# Patient Record
Sex: Female | Born: 1980 | Race: White | Hispanic: No | Marital: Married | Smoking: Never smoker
Health system: Southern US, Community
[De-identification: ages and names within clinical notes are randomized; demographics above are authoritative.]

## PROBLEM LIST (undated history)

## (undated) DIAGNOSIS — T8859XA Other complications of anesthesia, initial encounter: Secondary | ICD-10-CM

## (undated) DIAGNOSIS — D259 Leiomyoma of uterus, unspecified: Secondary | ICD-10-CM

## (undated) DIAGNOSIS — Z5189 Encounter for other specified aftercare: Secondary | ICD-10-CM

## (undated) DIAGNOSIS — Z862 Personal history of diseases of the blood and blood-forming organs and certain disorders involving the immune mechanism: Secondary | ICD-10-CM

## (undated) DIAGNOSIS — T4145XA Adverse effect of unspecified anesthetic, initial encounter: Secondary | ICD-10-CM

## (undated) DIAGNOSIS — Z789 Other specified health status: Secondary | ICD-10-CM

## (undated) DIAGNOSIS — O09299 Supervision of pregnancy with other poor reproductive or obstetric history, unspecified trimester: Secondary | ICD-10-CM

## (undated) DIAGNOSIS — F419 Anxiety disorder, unspecified: Secondary | ICD-10-CM

## (undated) HISTORY — PX: NO PAST SURGERIES: SHX2092

---

## 2009-03-17 ENCOUNTER — Encounter: Admission: RE | Admit: 2009-03-17 | Discharge: 2009-03-17 | Payer: Self-pay | Admitting: Obstetrics and Gynecology

## 2009-07-07 ENCOUNTER — Emergency Department (HOSPITAL_BASED_OUTPATIENT_CLINIC_OR_DEPARTMENT_OTHER): Admission: EM | Admit: 2009-07-07 | Discharge: 2009-07-07 | Payer: Self-pay | Admitting: Emergency Medicine

## 2009-08-25 ENCOUNTER — Encounter: Admission: RE | Admit: 2009-08-25 | Discharge: 2009-08-25 | Payer: Self-pay | Admitting: Obstetrics and Gynecology

## 2010-03-12 ENCOUNTER — Encounter: Admission: RE | Admit: 2010-03-12 | Discharge: 2010-03-12 | Payer: Self-pay | Admitting: Obstetrics and Gynecology

## 2010-08-12 LAB — URINALYSIS, ROUTINE W REFLEX MICROSCOPIC
Glucose, UA: NEGATIVE mg/dL
Nitrite: NEGATIVE
Protein, ur: NEGATIVE mg/dL
pH: 6.5 (ref 5.0–8.0)

## 2010-08-12 LAB — PREGNANCY, URINE: Preg Test, Ur: NEGATIVE

## 2010-09-06 ENCOUNTER — Other Ambulatory Visit: Payer: Self-pay | Admitting: Obstetrics and Gynecology

## 2010-09-06 DIAGNOSIS — Z09 Encounter for follow-up examination after completed treatment for conditions other than malignant neoplasm: Secondary | ICD-10-CM

## 2010-09-10 ENCOUNTER — Ambulatory Visit
Admission: RE | Admit: 2010-09-10 | Discharge: 2010-09-10 | Disposition: A | Discharge status: Home / Self Care | Payer: BC Managed Care – PPO | Source: Ambulatory Visit | Attending: Obstetrics and Gynecology | Admitting: Obstetrics and Gynecology

## 2010-09-10 DIAGNOSIS — Z09 Encounter for follow-up examination after completed treatment for conditions other than malignant neoplasm: Secondary | ICD-10-CM

## 2011-02-23 ENCOUNTER — Other Ambulatory Visit: Payer: Self-pay | Admitting: Obstetrics and Gynecology

## 2011-02-23 DIAGNOSIS — D249 Benign neoplasm of unspecified breast: Secondary | ICD-10-CM

## 2011-03-23 ENCOUNTER — Ambulatory Visit
Admission: RE | Admit: 2011-03-23 | Discharge: 2011-03-23 | Disposition: A | Discharge status: Home / Self Care | Payer: BC Managed Care – PPO | Source: Ambulatory Visit | Attending: Obstetrics and Gynecology | Admitting: Obstetrics and Gynecology

## 2011-03-23 DIAGNOSIS — D249 Benign neoplasm of unspecified breast: Secondary | ICD-10-CM

## 2013-05-23 DIAGNOSIS — O09299 Supervision of pregnancy with other poor reproductive or obstetric history, unspecified trimester: Secondary | ICD-10-CM

## 2013-05-23 DIAGNOSIS — Z5189 Encounter for other specified aftercare: Secondary | ICD-10-CM

## 2013-05-23 HISTORY — DX: Encounter for other specified aftercare: Z51.89

## 2013-05-23 HISTORY — DX: Supervision of pregnancy with other poor reproductive or obstetric history, unspecified trimester: O09.299

## 2014-02-10 LAB — OB RESULTS CONSOLE GBS: STREP GROUP B AG: NEGATIVE

## 2014-03-11 ENCOUNTER — Inpatient Hospital Stay (HOSPITAL_COMMUNITY)
Admission: AD | Admit: 2014-03-11 | Discharge: 2014-03-15 | DRG: 765 | Disposition: A | Discharge status: Home / Self Care | Payer: BC Managed Care – PPO | Source: Ambulatory Visit | Attending: Obstetrics & Gynecology | Admitting: Obstetrics & Gynecology

## 2014-03-11 ENCOUNTER — Inpatient Hospital Stay (HOSPITAL_COMMUNITY): Payer: BC Managed Care – PPO | Admitting: Anesthesiology

## 2014-03-11 ENCOUNTER — Encounter (HOSPITAL_COMMUNITY): Payer: Self-pay | Admitting: *Deleted

## 2014-03-11 ENCOUNTER — Encounter (HOSPITAL_COMMUNITY): Payer: BC Managed Care – PPO | Admitting: Anesthesiology

## 2014-03-11 DIAGNOSIS — D62 Acute posthemorrhagic anemia: Secondary | ICD-10-CM | POA: Diagnosis present

## 2014-03-11 DIAGNOSIS — Z3490 Encounter for supervision of normal pregnancy, unspecified, unspecified trimester: Secondary | ICD-10-CM

## 2014-03-11 DIAGNOSIS — O9902 Anemia complicating childbirth: Secondary | ICD-10-CM | POA: Diagnosis present

## 2014-03-11 DIAGNOSIS — Z3A40 40 weeks gestation of pregnancy: Secondary | ICD-10-CM | POA: Diagnosis present

## 2014-03-11 DIAGNOSIS — O480 Post-term pregnancy: Secondary | ICD-10-CM | POA: Diagnosis present

## 2014-03-11 DIAGNOSIS — O133 Gestational [pregnancy-induced] hypertension without significant proteinuria, third trimester: Secondary | ICD-10-CM | POA: Diagnosis present

## 2014-03-11 DIAGNOSIS — O3663X0 Maternal care for excessive fetal growth, third trimester, not applicable or unspecified: Secondary | ICD-10-CM | POA: Diagnosis present

## 2014-03-11 DIAGNOSIS — O321XX0 Maternal care for breech presentation, not applicable or unspecified: Secondary | ICD-10-CM | POA: Diagnosis present

## 2014-03-11 DIAGNOSIS — Z98891 History of uterine scar from previous surgery: Secondary | ICD-10-CM

## 2014-03-11 HISTORY — DX: Other specified health status: Z78.9

## 2014-03-11 LAB — COMPREHENSIVE METABOLIC PANEL
ALT: 9 U/L (ref 0–35)
AST: 15 U/L (ref 0–37)
Albumin: 2.9 g/dL — ABNORMAL LOW (ref 3.5–5.2)
Alkaline Phosphatase: 168 U/L — ABNORMAL HIGH (ref 39–117)
Anion gap: 16 — ABNORMAL HIGH (ref 5–15)
BUN: 14 mg/dL (ref 6–23)
CALCIUM: 9 mg/dL (ref 8.4–10.5)
CO2: 19 mEq/L (ref 19–32)
Chloride: 100 mEq/L (ref 96–112)
Creatinine, Ser: 0.68 mg/dL (ref 0.50–1.10)
GFR calc non Af Amer: >90 mL/min (ref >90)
GLUCOSE: 68 mg/dL — AB (ref 70–99)
Potassium: 4.1 mEq/L (ref 3.7–5.3)
Sodium: 135 mEq/L — ABNORMAL LOW (ref 137–147)
TOTAL PROTEIN: 6.4 g/dL (ref 6.0–8.3)
Total Bilirubin: <0.2 mg/dL — ABNORMAL LOW (ref 0.3–1.2)

## 2014-03-11 LAB — OB RESULTS CONSOLE RPR: RPR: NONREACTIVE

## 2014-03-11 LAB — CBC
HCT: 40.2 % (ref 36.0–46.0)
Hemoglobin: 13.8 g/dL (ref 12.0–15.0)
MCH: 31.9 pg (ref 26.0–34.0)
MCHC: 34.3 g/dL (ref 30.0–36.0)
MCV: 93.1 fL (ref 78.0–100.0)
PLATELETS: 250 10*3/uL (ref 150–400)
RBC: 4.32 MIL/uL (ref 3.87–5.11)
RDW: 12.8 % (ref 11.5–15.5)
WBC: 10.9 10*3/uL — ABNORMAL HIGH (ref 4.0–10.5)

## 2014-03-11 LAB — LACTATE DEHYDROGENASE: LDH: 180 U/L (ref 94–250)

## 2014-03-11 LAB — ABO/RH: ABO/RH(D): AB POS

## 2014-03-11 LAB — OB RESULTS CONSOLE GC/CHLAMYDIA
CHLAMYDIA, DNA PROBE: NEGATIVE
Gonorrhea: NEGATIVE

## 2014-03-11 LAB — OB RESULTS CONSOLE ANTIBODY SCREEN: ANTIBODY SCREEN: NEGATIVE

## 2014-03-11 LAB — OB RESULTS CONSOLE ABO/RH: RH TYPE: POSITIVE

## 2014-03-11 LAB — OB RESULTS CONSOLE RUBELLA ANTIBODY, IGM: RUBELLA: IMMUNE

## 2014-03-11 LAB — URIC ACID: URIC ACID, SERUM: 5.4 mg/dL (ref 2.4–7.0)

## 2014-03-11 LAB — OB RESULTS CONSOLE HEPATITIS B SURFACE ANTIGEN: HEP B S AG: NEGATIVE

## 2014-03-11 LAB — OB RESULTS CONSOLE HIV ANTIBODY (ROUTINE TESTING): HIV: NONREACTIVE

## 2014-03-11 MED ORDER — FENTANYL 2.5 MCG/ML BUPIVACAINE 1/10 % EPIDURAL INFUSION (WH - ANES)
14.0000 mL/h | INTRAMUSCULAR | Status: DC | PRN
  Administered 2014-03-11: 14 mL/h via EPIDURAL
  Filled 2014-03-11: qty 125

## 2014-03-11 MED ORDER — ACETAMINOPHEN 325 MG PO TABS: 650.0000 mg | ORAL_TABLET | ORAL | Status: DC | PRN

## 2014-03-11 MED ORDER — TERBUTALINE SULFATE 1 MG/ML IJ SOLN
0.2500 mg | Freq: Once | INTRAMUSCULAR | Status: AC | PRN
  Filled 2014-03-11: qty 1

## 2014-03-11 MED ORDER — DIPHENHYDRAMINE HCL 50 MG/ML IJ SOLN: 12.5000 mg | INTRAMUSCULAR | Status: DC | PRN

## 2014-03-11 MED ORDER — ONDANSETRON HCL 4 MG/2ML IJ SOLN: 4.0000 mg | Freq: Four times a day (QID) | INTRAMUSCULAR | Status: DC | PRN

## 2014-03-11 MED ORDER — PHENYLEPHRINE 40 MCG/ML (10ML) SYRINGE FOR IV PUSH (FOR BLOOD PRESSURE SUPPORT)
80.0000 ug | PREFILLED_SYRINGE | INTRAVENOUS | Status: DC | PRN
  Filled 2014-03-11: qty 10
  Filled 2014-03-11: qty 2

## 2014-03-11 MED ORDER — LIDOCAINE HCL (PF) 1 % IJ SOLN
INTRAMUSCULAR | Status: DC | PRN
  Administered 2014-03-11 (×4): 4 mL

## 2014-03-11 MED ORDER — CITRIC ACID-SODIUM CITRATE 334-500 MG/5ML PO SOLN
30.0000 mL | ORAL | Status: DC | PRN
  Administered 2014-03-12: 30 mL via ORAL
  Filled 2014-03-11: qty 15

## 2014-03-11 MED ORDER — LIDOCAINE HCL (PF) 1 % IJ SOLN: 30.0000 mL | INTRAMUSCULAR | Status: DC | PRN

## 2014-03-11 MED ORDER — PHENYLEPHRINE 40 MCG/ML (10ML) SYRINGE FOR IV PUSH (FOR BLOOD PRESSURE SUPPORT)
80.0000 ug | PREFILLED_SYRINGE | INTRAVENOUS | Status: DC | PRN
  Filled 2014-03-11: qty 2
  Filled 2014-03-11: qty 10

## 2014-03-11 MED ORDER — EPHEDRINE 5 MG/ML INJ
10.0000 mg | INTRAVENOUS | Status: DC | PRN
  Filled 2014-03-11: qty 2

## 2014-03-11 MED ORDER — MISOPROSTOL 25 MCG QUARTER TABLET
25.0000 ug | ORAL_TABLET | ORAL | Status: DC | PRN
  Administered 2014-03-11: 25 ug via VAGINAL
  Filled 2014-03-11: qty 0.25

## 2014-03-11 MED ORDER — OXYCODONE-ACETAMINOPHEN 5-325 MG PO TABS: 2.0000 | ORAL_TABLET | ORAL | Status: DC | PRN

## 2014-03-11 MED ORDER — LACTATED RINGERS IV SOLN
INTRAVENOUS | Status: DC
  Administered 2014-03-11: 125 mL/h via INTRAVENOUS
  Administered 2014-03-12 (×4): via INTRAVENOUS

## 2014-03-11 MED ORDER — OXYTOCIN BOLUS FROM INFUSION: 500.0000 mL | INTRAVENOUS | Status: DC

## 2014-03-11 MED ORDER — LACTATED RINGERS IV SOLN
500.0000 mL | INTRAVENOUS | Status: DC | PRN
  Administered 2014-03-12: 1000 mL via INTRAVENOUS

## 2014-03-11 MED ORDER — FLEET ENEMA 7-19 GM/118ML RE ENEM: 1.0000 | ENEMA | RECTAL | Status: DC | PRN

## 2014-03-11 MED ORDER — OXYCODONE-ACETAMINOPHEN 5-325 MG PO TABS: 1.0000 | ORAL_TABLET | ORAL | Status: DC | PRN

## 2014-03-11 MED ORDER — LACTATED RINGERS IV SOLN
500.0000 mL | Freq: Once | INTRAVENOUS | Status: AC
  Administered 2014-03-11: 500 mL via INTRAVENOUS

## 2014-03-11 MED ORDER — OXYTOCIN 40 UNITS IN LACTATED RINGERS INFUSION - SIMPLE MED: 62.5000 mL/h | INTRAVENOUS | Status: DC

## 2014-03-11 NOTE — Anesthesia Procedure Notes (Signed)
Epidural Patient location during procedure: OB Start time: 03/11/2014 11:27 PM  Staffing Anesthesiologist: Marrietta Thunder Performed by: anesthesiologist   Preanesthetic Checklist Completed: patient identified, site marked, surgical consent, pre-op evaluation, timeout performed, IV checked, risks and benefits discussed and monitors and equipment checked  Epidural Patient position: sitting Prep: site prepped and draped and DuraPrep Patient monitoring: continuous pulse ox and blood pressure Approach: midline Location: L3-L4 Injection technique: LOR air  Needle:  Needle type: Tuohy  Needle gauge: 17 G Needle length: 9 cm and 9 Needle insertion depth: 5 cm cm Catheter type: closed end flexible Catheter size: 19 Gauge Catheter at skin depth: 10 cm Test dose: negative  Assessment Events: blood not aspirated, injection not painful, no injection resistance, negative IV test and no paresthesia  Additional Notes Discussed risk of headache, infection, bleeding, nerve injury and failed or incomplete block.  Patient voices understanding and wishes to proceed.  Epidural placed easily on first attempt.  No paresthesia. Patient tolerated procedure well with no apparent complications.  Charlton Haws, MDReason for block:procedure for pain

## 2014-03-11 NOTE — Anesthesia Preprocedure Evaluation (Addendum)
Anesthesia Evaluation  Patient identified by MRN, date of birth, ID band Patient awake    Reviewed: Allergy & Precautions, H&P , NPO status , Patient's Chart, lab work & pertinent test results, reviewed documented beta blocker date and time   History of Anesthesia Complications Negative for: history of anesthetic complications  Airway Mallampati: II      Dental  (+) Teeth Intact   Pulmonary neg pulmonary ROS,  breath sounds clear to auscultation  Pulmonary exam normal       Cardiovascular hypertension (admitted for IOL for GHTN), Rhythm:regular Rate:Normal     Neuro/Psych negative neurological ROS  negative psych ROS   GI/Hepatic negative GI ROS, Neg liver ROS,   Endo/Other  negative endocrine ROS  Renal/GU      Musculoskeletal   Abdominal   Peds  Hematology negative hematology ROS (+)   Anesthesia Other Findings   Reproductive/Obstetrics negative OB ROS (+) Pregnancy (LGA, failure to descend --> primary C/S)                          Anesthesia Physical Anesthesia Plan  ASA: II and emergent  Anesthesia Plan: Epidural   Post-op Pain Management:    Induction:   Airway Management Planned:   Additional Equipment:   Intra-op Plan:   Post-operative Plan:   Informed Consent: I have reviewed the patients History and Physical, chart, labs and discussed the procedure including the risks, benefits and alternatives for the proposed anesthesia with the patient or authorized representative who has indicated his/her understanding and acceptance.   Dental Advisory Given  Plan Discussed with: CRNA and Surgeon  Anesthesia Plan Comments:        Anesthesia Quick Evaluation

## 2014-03-11 NOTE — H&P (Signed)
Kerri Yu is a 33 y.o. female presenting for induction of labor secondary to gestational hypertension; post-dates.  Patient has had uncomplicated pregnancy.  Last u/s 10/14 with EFW 9,6 and AC measuring 42 weeks; normal glucola.  Patient denies HA, CP/SOB, RUQ pain, no visual disturbance.    Maternal Medical History:  Fetal activity: Perceived fetal activity is normal.   Last perceived fetal movement was within the past hour.    Prenatal complications: PIH.   Prenatal Complications - Diabetes: none.    OB History   Grav Para Term Preterm Abortions TAB SAB Ect Mult Living   1 0 0 0 0 0 0 0 0 0      Past Medical History  Diagnosis Date  . Medical history non-contributory    Past Surgical History  Procedure Laterality Date  . No past surgeries     Family History: family history is not on file. Social History:  reports that she has never smoked. She has never used smokeless tobacco. She reports that she does not drink alcohol or use illicit drugs.   Prenatal Transfer Tool  Maternal Diabetes: No Genetic Screening: Normal Maternal Ultrasounds/Referrals: Normal Fetal Ultrasounds or other Referrals:  None Maternal Substance Abuse:  No Significant Maternal Medications:  None Significant Maternal Lab Results:  Lab values include: Group B Strep negative Other Comments:  None  ROS  Dilation: 1.5 Effacement (%): 60 Station: -2 Exam by:: Cason Dabney Blood pressure 116/74, pulse 91, temperature 98.5 F (36.9 C), temperature source Oral, resp. rate 16, height 5\' 4"  (1.626 m), weight 160 lb (72.576 kg), last menstrual period 06/01/2013. Maternal Exam:  Uterine Assessment: Contraction strength is mild.  Contraction frequency is rare.   Abdomen: Patient reports no abdominal tenderness. Fundal height is S>D.   Estimated fetal weight is 9#6 (u/s 10/14).   Fetal presentation: vertex  Introitus: Normal vulva. Pelvis: adequate for delivery.   Cervix: Cervix evaluated by digital exam.      Physical Exam  Constitutional: She is oriented to person, place, and time. She appears well-developed and well-nourished.  GI: Soft. There is no rebound and no guarding.  Neurological: She is alert and oriented to person, place, and time.  Skin: Skin is warm and dry.  Psychiatric: She has a normal mood and affect. Her behavior is normal.    Prenatal labs: ABO, Rh:   Antibody:   Rubella:   RPR:    HBsAg:    HIV:    GBS:     Assessment/Plan: 33yo G1 at [redacted]w[redacted]d for IOL -HELLP labs pending; will watch BPs -Plan VMP induction; patient counseled -Epidural if desired   Navin Dogan 03/11/2014, 6:57 PM

## 2014-03-12 ENCOUNTER — Encounter (HOSPITAL_COMMUNITY): Payer: Self-pay

## 2014-03-12 ENCOUNTER — Encounter (HOSPITAL_COMMUNITY)
Admission: AD | Disposition: A | Discharge status: Home / Self Care | Payer: Self-pay | Source: Ambulatory Visit | Attending: Obstetrics & Gynecology

## 2014-03-12 DIAGNOSIS — Z98891 History of uterine scar from previous surgery: Secondary | ICD-10-CM

## 2014-03-12 LAB — CBC
HCT: 24.1 % — ABNORMAL LOW (ref 36.0–46.0)
HEMATOCRIT: 26.4 % — AB (ref 36.0–46.0)
HEMOGLOBIN: 8.5 g/dL — AB (ref 12.0–15.0)
Hemoglobin: 9.3 g/dL — ABNORMAL LOW (ref 12.0–15.0)
MCH: 32.2 pg (ref 26.0–34.0)
MCH: 32.3 pg (ref 26.0–34.0)
MCHC: 35.2 g/dL (ref 30.0–36.0)
MCHC: 35.3 g/dL (ref 30.0–36.0)
MCV: 91.3 fL (ref 78.0–100.0)
MCV: 91.6 fL (ref 78.0–100.0)
Platelets: 147 10*3/uL — ABNORMAL LOW (ref 150–400)
Platelets: 135 10*3/uL — ABNORMAL LOW (ref 150–400)
RBC: 2.89 MIL/uL — ABNORMAL LOW (ref 3.87–5.11)
RBC: 2.63 MIL/uL — AB (ref 3.87–5.11)
RDW: 13.1 % (ref 11.5–15.5)
RDW: 13.3 % (ref 11.5–15.5)
WBC: 15.8 10*3/uL — ABNORMAL HIGH (ref 4.0–10.5)
WBC: 16.6 10*3/uL — AB (ref 4.0–10.5)

## 2014-03-12 LAB — RPR

## 2014-03-12 SURGERY — Surgical Case
Anesthesia: Epidural | Site: Abdomen

## 2014-03-12 MED ORDER — NALBUPHINE HCL 10 MG/ML IJ SOLN: 5.0000 mg | INTRAMUSCULAR | Status: DC | PRN

## 2014-03-12 MED ORDER — NALBUPHINE HCL 10 MG/ML IJ SOLN: 5.0000 mg | Freq: Once | INTRAMUSCULAR | Status: AC | PRN

## 2014-03-12 MED ORDER — SIMETHICONE 80 MG PO CHEW
80.0000 mg | CHEWABLE_TABLET | Freq: Three times a day (TID) | ORAL | Status: DC
  Administered 2014-03-12 – 2014-03-15 (×9): 80 mg via ORAL
  Filled 2014-03-12 (×9): qty 1

## 2014-03-12 MED ORDER — ONDANSETRON HCL 4 MG/2ML IJ SOLN
INTRAMUSCULAR | Status: AC
  Filled 2014-03-12: qty 2

## 2014-03-12 MED ORDER — MORPHINE SULFATE 0.5 MG/ML IJ SOLN
INTRAMUSCULAR | Status: AC
  Filled 2014-03-12: qty 10

## 2014-03-12 MED ORDER — NALOXONE HCL 0.4 MG/ML IJ SOLN: 0.4000 mg | INTRAMUSCULAR | Status: DC | PRN

## 2014-03-12 MED ORDER — SIMETHICONE 80 MG PO CHEW: 80.0000 mg | CHEWABLE_TABLET | ORAL | Status: DC | PRN

## 2014-03-12 MED ORDER — DIBUCAINE 1 % RE OINT
1.0000 | TOPICAL_OINTMENT | RECTAL | Status: DC | PRN
Start: 2014-03-12 — End: 2014-03-15

## 2014-03-12 MED ORDER — WITCH HAZEL-GLYCERIN EX PADS: 1.0000 "application " | MEDICATED_PAD | CUTANEOUS | Status: DC | PRN

## 2014-03-12 MED ORDER — SIMETHICONE 80 MG PO CHEW
80.0000 mg | CHEWABLE_TABLET | ORAL | Status: DC
  Administered 2014-03-13 (×2): 80 mg via ORAL
  Filled 2014-03-12 (×3): qty 1

## 2014-03-12 MED ORDER — OXYTOCIN 40 UNITS IN LACTATED RINGERS INFUSION - SIMPLE MED: 62.5000 mL/h | INTRAVENOUS | Status: AC

## 2014-03-12 MED ORDER — KETOROLAC TROMETHAMINE 30 MG/ML IJ SOLN
INTRAMUSCULAR | Status: AC
  Filled 2014-03-12: qty 1

## 2014-03-12 MED ORDER — SODIUM CHLORIDE 0.9 % IJ SOLN: 3.0000 mL | INTRAMUSCULAR | Status: DC | PRN

## 2014-03-12 MED ORDER — ONDANSETRON HCL 4 MG/2ML IJ SOLN
INTRAMUSCULAR | Status: DC | PRN
  Administered 2014-03-12: 4 mg via INTRAVENOUS

## 2014-03-12 MED ORDER — SCOPOLAMINE 1 MG/3DAYS TD PT72
MEDICATED_PATCH | TRANSDERMAL | Status: DC | PRN
  Administered 2014-03-12: 1 via TRANSDERMAL

## 2014-03-12 MED ORDER — SCOPOLAMINE 1 MG/3DAYS TD PT72
MEDICATED_PATCH | TRANSDERMAL | Status: AC
  Filled 2014-03-12: qty 1

## 2014-03-12 MED ORDER — PHENYLEPHRINE HCL 10 MG/ML IJ SOLN
INTRAMUSCULAR | Status: DC | PRN
  Administered 2014-03-12: 40 ug via INTRAVENOUS
  Administered 2014-03-12: 80 ug via INTRAVENOUS
  Administered 2014-03-12: 40 ug via INTRAVENOUS
  Administered 2014-03-12: 80 ug via INTRAVENOUS
  Administered 2014-03-12: 40 ug via INTRAVENOUS
  Administered 2014-03-12: 80 ug via INTRAVENOUS
  Administered 2014-03-12 (×2): 40 ug via INTRAVENOUS
  Administered 2014-03-12: 80 ug via INTRAVENOUS
  Administered 2014-03-12 (×2): 40 ug via INTRAVENOUS
  Administered 2014-03-12: 80 ug via INTRAVENOUS
  Administered 2014-03-12: 40 ug via INTRAVENOUS
  Administered 2014-03-12: 80 ug via INTRAVENOUS

## 2014-03-12 MED ORDER — CEFAZOLIN SODIUM-DEXTROSE 2-3 GM-% IV SOLR
2.0000 g | Freq: Once | INTRAVENOUS | Status: AC
  Administered 2014-03-12: 2 g via INTRAVENOUS
  Filled 2014-03-12: qty 50

## 2014-03-12 MED ORDER — OXYTOCIN 10 UNIT/ML IJ SOLN
INTRAMUSCULAR | Status: AC
  Filled 2014-03-12: qty 4

## 2014-03-12 MED ORDER — KETOROLAC TROMETHAMINE 30 MG/ML IJ SOLN: 15.0000 mg | Freq: Once | INTRAMUSCULAR | Status: DC | PRN

## 2014-03-12 MED ORDER — LIDOCAINE-EPINEPHRINE (PF) 2 %-1:200000 IJ SOLN
INTRAMUSCULAR | Status: AC
  Filled 2014-03-12: qty 20

## 2014-03-12 MED ORDER — DIPHENHYDRAMINE HCL 25 MG PO CAPS: 25.0000 mg | ORAL_CAPSULE | Freq: Four times a day (QID) | ORAL | Status: DC | PRN

## 2014-03-12 MED ORDER — MENTHOL 3 MG MT LOZG: 1.0000 | LOZENGE | OROMUCOSAL | Status: DC | PRN

## 2014-03-12 MED ORDER — OXYTOCIN 10 UNIT/ML IJ SOLN
40.0000 [IU] | INTRAVENOUS | Status: DC | PRN
  Administered 2014-03-12: 40 [IU] via INTRAVENOUS

## 2014-03-12 MED ORDER — SODIUM BICARBONATE 8.4 % IV SOLN
INTRAVENOUS | Status: AC
  Filled 2014-03-12: qty 50

## 2014-03-12 MED ORDER — LANOLIN HYDROUS EX OINT
1.0000 | TOPICAL_OINTMENT | CUTANEOUS | Status: DC | PRN
Start: 2014-03-12 — End: 2014-03-15

## 2014-03-12 MED ORDER — IBUPROFEN 600 MG PO TABS
600.0000 mg | ORAL_TABLET | Freq: Four times a day (QID) | ORAL | Status: DC
  Administered 2014-03-12 – 2014-03-15 (×12): 600 mg via ORAL
  Filled 2014-03-12 (×13): qty 1

## 2014-03-12 MED ORDER — ONDANSETRON HCL 4 MG PO TABS: 4.0000 mg | ORAL_TABLET | ORAL | Status: DC | PRN

## 2014-03-12 MED ORDER — MEPERIDINE HCL 25 MG/ML IJ SOLN: 6.2500 mg | INTRAMUSCULAR | Status: DC | PRN

## 2014-03-12 MED ORDER — METHYLERGONOVINE MALEATE 0.2 MG/ML IJ SOLN
INTRAMUSCULAR | Status: AC
  Filled 2014-03-12: qty 1

## 2014-03-12 MED ORDER — DIPHENHYDRAMINE HCL 25 MG PO CAPS
25.0000 mg | ORAL_CAPSULE | ORAL | Status: DC | PRN
  Filled 2014-03-12: qty 1

## 2014-03-12 MED ORDER — PHENYLEPHRINE 40 MCG/ML (10ML) SYRINGE FOR IV PUSH (FOR BLOOD PRESSURE SUPPORT)
PREFILLED_SYRINGE | INTRAVENOUS | Status: AC
  Filled 2014-03-12: qty 5

## 2014-03-12 MED ORDER — DIPHENHYDRAMINE HCL 50 MG/ML IJ SOLN: 12.5000 mg | INTRAMUSCULAR | Status: DC | PRN

## 2014-03-12 MED ORDER — ONDANSETRON HCL 4 MG/2ML IJ SOLN: 4.0000 mg | INTRAMUSCULAR | Status: DC | PRN

## 2014-03-12 MED ORDER — LACTATED RINGERS IV SOLN
INTRAVENOUS | Status: DC | PRN
  Administered 2014-03-12 (×2): via INTRAVENOUS

## 2014-03-12 MED ORDER — PRENATAL MULTIVITAMIN CH
1.0000 | ORAL_TABLET | Freq: Every day | ORAL | Status: DC
  Administered 2014-03-13 – 2014-03-15 (×3): 1 via ORAL
  Filled 2014-03-12 (×3): qty 1

## 2014-03-12 MED ORDER — METOCLOPRAMIDE HCL 5 MG/ML IJ SOLN: 10.0000 mg | Freq: Once | INTRAMUSCULAR | Status: DC | PRN

## 2014-03-12 MED ORDER — FENTANYL CITRATE 0.05 MG/ML IJ SOLN: 25.0000 ug | INTRAMUSCULAR | Status: DC | PRN

## 2014-03-12 MED ORDER — NALOXONE HCL 1 MG/ML IJ SOLN
1.0000 ug/kg/h | INTRAVENOUS | Status: DC | PRN
  Filled 2014-03-12: qty 2

## 2014-03-12 MED ORDER — TETANUS-DIPHTH-ACELL PERTUSSIS 5-2.5-18.5 LF-MCG/0.5 IM SUSP: 0.5000 mL | Freq: Once | INTRAMUSCULAR | Status: DC

## 2014-03-12 MED ORDER — OXYCODONE-ACETAMINOPHEN 5-325 MG PO TABS
1.0000 | ORAL_TABLET | ORAL | Status: DC | PRN
  Administered 2014-03-13: 1 via ORAL
  Filled 2014-03-12: qty 1

## 2014-03-12 MED ORDER — ONDANSETRON HCL 4 MG/2ML IJ SOLN: 4.0000 mg | Freq: Three times a day (TID) | INTRAMUSCULAR | Status: DC | PRN

## 2014-03-12 MED ORDER — KETOROLAC TROMETHAMINE 30 MG/ML IJ SOLN: 30.0000 mg | Freq: Four times a day (QID) | INTRAMUSCULAR | Status: AC | PRN

## 2014-03-12 MED ORDER — OXYCODONE-ACETAMINOPHEN 5-325 MG PO TABS: 2.0000 | ORAL_TABLET | ORAL | Status: DC | PRN

## 2014-03-12 MED ORDER — INFLUENZA VAC SPLIT QUAD 0.5 ML IM SUSY: 0.5000 mL | PREFILLED_SYRINGE | INTRAMUSCULAR | Status: DC

## 2014-03-12 MED ORDER — SODIUM BICARBONATE 8.4 % IV SOLN
INTRAVENOUS | Status: DC | PRN
  Administered 2014-03-12 (×2): 5 mL via EPIDURAL

## 2014-03-12 MED ORDER — LACTATED RINGERS IV SOLN
INTRAVENOUS | Status: DC
  Administered 2014-03-12 – 2014-03-13 (×3): via INTRAVENOUS

## 2014-03-12 MED ORDER — CEFAZOLIN SODIUM-DEXTROSE 2-3 GM-% IV SOLR
INTRAVENOUS | Status: AC
  Filled 2014-03-12: qty 50

## 2014-03-12 MED ORDER — ZOLPIDEM TARTRATE 5 MG PO TABS: 5.0000 mg | ORAL_TABLET | Freq: Every evening | ORAL | Status: DC | PRN

## 2014-03-12 MED ORDER — KETOROLAC TROMETHAMINE 30 MG/ML IJ SOLN
30.0000 mg | Freq: Four times a day (QID) | INTRAMUSCULAR | Status: AC | PRN
  Administered 2014-03-12: 30 mg via INTRAMUSCULAR

## 2014-03-12 MED ORDER — METHYLERGONOVINE MALEATE 0.2 MG/ML IJ SOLN
0.2000 mg | Freq: Once | INTRAMUSCULAR | Status: AC
  Administered 2014-03-12: 0.2 mg via INTRAMUSCULAR

## 2014-03-12 MED ORDER — SENNOSIDES-DOCUSATE SODIUM 8.6-50 MG PO TABS
2.0000 | ORAL_TABLET | ORAL | Status: DC
  Administered 2014-03-13 – 2014-03-15 (×3): 2 via ORAL
  Filled 2014-03-12 (×3): qty 2

## 2014-03-12 MED ORDER — SCOPOLAMINE 1 MG/3DAYS TD PT72: 1.0000 | MEDICATED_PATCH | Freq: Once | TRANSDERMAL | Status: DC

## 2014-03-12 SURGICAL SUPPLY — 34 items
BENZOIN TINCTURE PRP APPL 2/3 (GAUZE/BANDAGES/DRESSINGS) ×3 IMPLANT
CLAMP CORD UMBIL (MISCELLANEOUS) IMPLANT
CLOSURE WOUND 1/2 X4 (GAUZE/BANDAGES/DRESSINGS) ×1
CLOTH BEACON ORANGE TIMEOUT ST (SAFETY) ×3 IMPLANT
DERMABOND ADVANCED (GAUZE/BANDAGES/DRESSINGS)
DERMABOND ADVANCED .7 DNX12 (GAUZE/BANDAGES/DRESSINGS) IMPLANT
DRAPE SHEET LG 3/4 BI-LAMINATE (DRAPES) IMPLANT
DRSG OPSITE POSTOP 4X10 (GAUZE/BANDAGES/DRESSINGS) ×3 IMPLANT
DURAPREP 26ML APPLICATOR (WOUND CARE) ×3 IMPLANT
ELECT REM PT RETURN 9FT ADLT (ELECTROSURGICAL) ×3
ELECTRODE REM PT RTRN 9FT ADLT (ELECTROSURGICAL) ×1 IMPLANT
EXTRACTOR VACUUM KIWI (MISCELLANEOUS) ×3 IMPLANT
EXTRACTOR VACUUM M CUP 4 TUBE (SUCTIONS) IMPLANT
EXTRACTOR VACUUM M CUP 4' TUBE (SUCTIONS)
GLOVE BIO SURGEON STRL SZ 6 (GLOVE) ×3 IMPLANT
GLOVE BIOGEL PI IND STRL 6 (GLOVE) ×2 IMPLANT
GLOVE BIOGEL PI INDICATOR 6 (GLOVE) ×4
GOWN STRL REUS W/TWL LRG LVL3 (GOWN DISPOSABLE) ×6 IMPLANT
KIT ABG SYR 3ML LUER SLIP (SYRINGE) ×3 IMPLANT
NEEDLE HYPO 25X5/8 SAFETYGLIDE (NEEDLE) ×3 IMPLANT
NS IRRIG 1000ML POUR BTL (IV SOLUTION) ×3 IMPLANT
PACK C SECTION WH (CUSTOM PROCEDURE TRAY) ×3 IMPLANT
PAD OB MATERNITY 4.3X12.25 (PERSONAL CARE ITEMS) ×3 IMPLANT
STAPLER VISISTAT 35W (STAPLE) IMPLANT
STRIP CLOSURE SKIN 1/2X4 (GAUZE/BANDAGES/DRESSINGS) ×2 IMPLANT
SUT CHROMIC 0 CTX 36 (SUTURE) ×9 IMPLANT
SUT MON AB 2-0 CT1 27 (SUTURE) ×3 IMPLANT
SUT PDS AB 0 CT1 27 (SUTURE) IMPLANT
SUT PLAIN 0 NONE (SUTURE) IMPLANT
SUT VIC AB 0 CT1 36 (SUTURE) ×3 IMPLANT
SUT VIC AB 4-0 KS 27 (SUTURE) ×3 IMPLANT
TOWEL OR 17X24 6PK STRL BLUE (TOWEL DISPOSABLE) ×3 IMPLANT
TRAY FOLEY CATH 14FR (SET/KITS/TRAYS/PACK) IMPLANT
WATER STERILE IRR 1000ML POUR (IV SOLUTION) ×3 IMPLANT

## 2014-03-12 NOTE — Progress Notes (Signed)
CTSP: Had about 200cc of clots in PACU  VSS FF Normal bleeding on perineum noted Examined with Dr Marni Griffon  Will give methergine, IV fluids, observation, T&S

## 2014-03-12 NOTE — Transfer of Care (Signed)
Immediate Anesthesia Transfer of Care Note  Patient: Kerri Yu  Procedure(s) Performed: Procedure(s): CESAREAN SECTION (N/A)  Patient Location: PACU  Anesthesia Type:Epidural  Level of Consciousness: awake, alert  and oriented  Airway & Oxygen Therapy: Patient Spontanous Breathing  Post-op Assessment: Report given to PACU RN and Post -op Vital signs reviewed and stable  Post vital signs: Reviewed and stable  Complications: No apparent anesthesia complications

## 2014-03-12 NOTE — Anesthesia Postprocedure Evaluation (Addendum)
  Anesthesia Post-op Note  Patient: Kerri Yu  Procedure(s) Performed: Procedure(s): CESAREAN SECTION (N/A)  Patient Location: PACU  Anesthesia Type:Epidural  Level of Consciousness: awake, alert  and oriented  Airway and Oxygen Therapy: Patient Spontanous Breathing  Post-op Pain: none  Post-op Assessment: Post-op Vital signs reviewed, Patient's Cardiovascular Status Stable, Respiratory Function Stable, Patent Airway, No signs of Nausea or vomiting, Pain level controlled, No headache, No backache, No residual numbness and No residual motor weakness. Will leave epidural catheter in place in case of Post Partum hemorrhage. Plan to D/C later this pm or in am.  Post-op Vital Signs: Reviewed and stable  Last Vitals:  Filed Vitals:   03/12/14 0945  BP: 135/82  Pulse: 80  Temp:   Resp: 19    Complications: No apparent anesthesia complications

## 2014-03-12 NOTE — Op Note (Signed)
[name] PROCEDURE DATE: 03/11/2014 - 03/12/2014  PREOPERATIVE DIAGNOSIS: Intrauterine pregnancy at  [redacted]w[redacted]d weeks gestation with arrest of descent  POSTOPERATIVE DIAGNOSIS: The same  PROCEDURE:   Primary Low Transverse Cesarean Section  SURGEON:  Dr. [name]  INDICATIONS: Kerri Yu is a 33 y.o. G1P1001 at [redacted]w[redacted]d scheduled for cesarean section secondary to arrest of descent.  The risks of cesarean section discussed with the patient included but were not limited to: bleeding which may require transfusion or reoperation; infection which may require antibiotics; injury to bowel, bladder, ureters or other surrounding organs; injury to the fetus; need for additional procedures including hysterectomy in the event of a life-threatening hemorrhage; placental abnormalities wth subsequent pregnancies, incisional problems, thromboembolic phenomenon and other postoperative/anesthesia complications. The patient concurred with the proposed plan, giving informed written consent for the procedure.    FINDINGS:  Viable female infant in cephalic presentation but turned to frank breech for delivery, APGARs 2,9:  Weight 9#9.  Clear amniotic fluid.  Intact placenta, three vessel cord.  Grossly normal ovaries and fallopian tubes.  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 .   ANESTHESIA:    Epidural ESTIMATED BLOOD LOSS: 1000 ml SPECIMENS: Placenta sent to pathology COMPLICATIONS: None immediate  PROCEDURE IN DETAIL:  The patient received intravenous antibiotics and had sequential compression devices applied to her lower extremities while in the preoperative area.  She was then taken to the operating room where epidural anesthesia was dosed up to surgical level and was found to be adequate. She was then placed in a dorsal supine position with a leftward tilt, and prepped and draped in a sterile manner.  A foley catheter was placed into her bladder and attached to constant gravity.  After an  adequate timeout was performed, a Pfannenstiel skin incision was made with scalpel and carried through to the underlying layer of fascia. The fascia was incised in the midline and this incision was extended bilaterally using the Mayo scissors. Kocher clamps were applied to the superior aspect of the fascial incision and the underlying rectus muscles were dissected off bluntly. A similar process was carried out on the inferior aspect of the facial incision. The rectus muscles were separated in the midline bluntly and the peritoneum was entered bluntly.   A bladder flap was created sharply and developed bluntly.  The bladder was protected behind the bladder blade.  A transverse hysterotomy was made with a scalpel and extended bilaterally bluntly. The bladder blade was then removed. After attempt was made to deliver the infant with a single Kiwi vacuum pull attempt with pop off, the baby turned to frank breech.  Using typical breech maneuvers, the baby was delivered. The cord was clamped and cut and infant was handed over to awaiting neonatology team. Uterine massage was then administered and the placenta delivered intact with three-vessel cord. The uterus was cleared of clot and debris.  The hysterotomy was closed with 0 chromic.  A second imbricating suture of 0-chromic was used to reinforce the incision and aid in hemostasis.  The peritoneum and rectus muscles were noted to be hemostatic and were reapproximated using 2-0 monocryl in a running fashion.  The fascia was closed with 0-Vicryl in a running fashion with good restoration of anatomy.  The subcutaneus tissue was copiously irrigated.  The skin was closed with 4-0 vicryl in a subcuticular fashion.  Pt tolerated the procedure will.  All counts were correct x2.  Pt went to the recovery room in stable condition.

## 2014-03-12 NOTE — Lactation Note (Signed)
This note was copied from the chart of Kerri [name]. Lactation Consultation Note Initial visit at 13 hours of age.  Baby has been spitting up mucous and mom had a 1500 EBL with labor and then c/s delivery.  Discussed normal lactogenesis II and how to contact for assist if mom feels her milk supply is not adequate due to blood loss.  Worked on hand expression with colostrum collected in small container to give to baby when he is ready to feed.  Hand pump given with instructions to help evert nipples that flatten with compression.  Baby asleep in crib.  Placed baby STS with mom baby is not waking for feeding.  [facility] LC resources given and discussed.  Encouraged to feed with early cues on demand.  Early newborn behavior discussed.   Mom plans to wake baby with STS for breast attempts and will try to offer colostrum when baby wakes.  Discussed DEBP, mom will let us know if she is ready for pumping.   Mom to call for assist as needed.      Patient Name: Kerri Yu Today's Date: 03/12/2014 Reason for consult: Initial assessment   Maternal Data Has patient been taught Hand Expression?: Yes Does the patient have breastfeeding experience prior to this delivery?: No  Feeding Feeding Type: Breast Fed Length of feed: 0 min  LATCH Score/Interventions Latch: Too sleepy or reluctant, no latch achieved, no sucking elicited.  Audible Swallowing: None  Type of Nipple: Everted at rest and after stimulation (flaten with compression)  Comfort (Breast/Nipple): Soft / non-tender     Hold (Positioning): Assistance needed to correctly position infant at breast and maintain latch.  LATCH Score: 5  Lactation Tools Discussed/Used Tools: Pump Breast pump type: Manual Initiated by:: JS Date initiated:: 03/12/14   Consult Status Consult Status: Follow-up Date: 03/13/14 Follow-up type: In-patient    Kerri Yu 03/12/2014, 5:56 PM

## 2014-03-12 NOTE — Consult Note (Signed)
Neonatology Note:   Attendance at C-section:    I was asked by Dr. Lynnette Caffey to attend this primary C/S at term following induction for gestational HTN, failure to descend and NRFHR. Known LGA infant. The mother is a G1P0 AB pos, GBS neg with gestational HTN. ROM 8 hours prior to delivery, fluid clear. Difficult extraction with vacuum attempt, then delivered breech. Infant floppy, apneic, with HR about 80 at birth. We bulb suctioned, then gave stimulation, but there was only one cough, then apnea, so I applied PPV. Continued PPV for 1.25 minutes, with improving color and occasional respiratory effort. At 2 minutes, the baby began to cry. Tone was normal by 2.5 minutes, perfusion excellent. A pulse oximeter showed the O2 saturations were within normal range throughout. Ap 2/9. Lungs clear to ausc in DR. To CN to care of Pediatrician.   Real Cons, MD

## 2014-03-12 NOTE — Progress Notes (Signed)
Patient has been complete x 2 hours and has been pushing very well x 1 hour.  There has been no descent from the +1 station.  Last EFW was 9#6 with large AC.  Patient is counseled for C/S secondary to arrest of descent.  She is informed of the risk of bleeding, infection, scarring, damage to surrounding structures, and effects in future pregnancies.  All questions were answered and the patient is ready to proceed.  [name], DO

## 2014-03-12 NOTE — Anesthesia Postprocedure Evaluation (Signed)
  Anesthesia Post-op Note  Patient: Kerri Yu  Procedure(s) Performed: Procedure(s): CESAREAN SECTION (N/A)  Patient Location: Mother/Baby  Anesthesia Type:Epidural  Level of Consciousness: awake, alert  and oriented  Airway and Oxygen Therapy: Patient Spontanous Breathing  Post-op Pain: none  Post-op Assessment: Post-op Vital signs reviewed, Patient's Cardiovascular Status Stable, Respiratory Function Stable, Patent Airway, NAUSEA AND VOMITING PRESENT, No headache, No backache, No residual numbness and No residual motor weakness  Post-op Vital Signs: Reviewed and stable  Last Vitals:  Filed Vitals:   03/12/14 1310  BP: 122/64  Pulse: 82  Temp: 36.3 C  Resp: 16    Complications: No apparent anesthesia complications

## 2014-03-12 NOTE — Addendum Note (Signed)
Addendum created 03/12/14 1427 by Jonna Munro, CRNA   Modules edited: Notes Section   Notes Section:  File: [id number]

## 2014-03-12 NOTE — Progress Notes (Signed)
Pain relief good  VSS Afeb UO clearing FFNT, 2 below umbilicus  A/P: Stable         CBC

## 2014-03-13 ENCOUNTER — Encounter (HOSPITAL_COMMUNITY): Payer: Self-pay | Admitting: Obstetrics & Gynecology

## 2014-03-13 LAB — CBC
HCT: 20.1 % — ABNORMAL LOW (ref 36.0–46.0)
HEMOGLOBIN: 6.9 g/dL — AB (ref 12.0–15.0)
MCH: 31.7 pg (ref 26.0–34.0)
MCHC: 34.3 g/dL (ref 30.0–36.0)
MCV: 92.2 fL (ref 78.0–100.0)
PLATELETS: 127 10*3/uL — AB (ref 150–400)
RBC: 2.18 MIL/uL — AB (ref 3.87–5.11)
RDW: 13.6 % (ref 11.5–15.5)
WBC: 13.2 10*3/uL — ABNORMAL HIGH (ref 4.0–10.5)

## 2014-03-13 MED ORDER — FERROUS SULFATE 325 (65 FE) MG PO TABS
325.0000 mg | ORAL_TABLET | Freq: Three times a day (TID) | ORAL | Status: DC
  Administered 2014-03-13: 325 mg via ORAL
  Filled 2014-03-13: qty 1

## 2014-03-13 MED ORDER — FERROUS SULFATE 325 (65 FE) MG PO TABS
325.0000 mg | ORAL_TABLET | Freq: Two times a day (BID) | ORAL | Status: DC
  Administered 2014-03-13 – 2014-03-15 (×4): 325 mg via ORAL
  Filled 2014-03-13 (×4): qty 1

## 2014-03-13 NOTE — Addendum Note (Signed)
Addendum created 03/13/14 1643 by Lyn Hollingshead, MD   Modules edited: Orders, PRL Based Order Sets

## 2014-03-13 NOTE — Progress Notes (Signed)
Subjective: Postpartum Day 1: Cesarean Delivery Patient reports tolerating PO.    Objective: Vital signs in last 24 hours: Temp:  [97.4 F (36.3 C)-99.8 F (37.7 C)] 97.8 F (36.6 C) (10/22 0627) Pulse Rate:  [66-113] 67 (10/22 0627) Resp:  [16-20] 16 (10/22 0627) BP: (90-135)/(49-82) 100/52 mmHg (10/22 0627) SpO2:  [89 %-99 %] 99 % (10/22 7353)  Physical Exam:  General: alert and cooperative Lochia: appropriate Uterine Fundus: firm Incision: healing well DVT Evaluation: No evidence of DVT seen on physical exam. Negative Homan's sign. No cords or calf tenderness. No significant calf/ankle edema.   Recent Labs  03/12/14 1501 03/13/14 0646  HGB 8.5* 6.9*  HCT 24.1* 20.1*    Assessment/Plan: Status post Cesarean section. Postoperative course complicated by anemia   Continue current care.  Galia Rahm G 03/13/2014, 9:01 AM

## 2014-03-13 NOTE — Lactation Note (Signed)
This note was copied from the chart of Kerri Yu. Lactation Consultation Note      Follow up consult with this mom of a term baby, first apgar 2,mom with a 1200 ml blood loss, baby  now sleepy and not latching. I reviewed hand expression with mom, and we expressed at least 2 mls of colostrum, and I showed the parents how to use the foley cup to feed him. He took at least 5 minutes to feed, still very sleepy, but did finish the 2 mls of colostrum. I set mom up with a DEP, instructed her in it's use, and advised her to pump and HE every 3 hours, and to call for assistance with latching if baby shows cues, and to feed what she expresses. Parents very receptive to teaching.   Patient Name: Kerri Henrine Hayter [id number] Date: 03/13/2014 Reason for consult: Follow-up assessment   Maternal Data    Feeding Feeding Type: Breast Milk Length of feed: 5 min  LATCH Score/Interventions Latch: Too sleepy or reluctant, no latch achieved, no sucking elicited.                    Lactation Tools Discussed/Used Tools: Feeding cup Pump Review: Setup, frequency, and cleaning;Milk Storage;Other (comment) (premie setting, hand expresion) Initiated by:: clee rn Date initiated:: 03/13/14   Consult Status Consult Status: Follow-up Date: 03/14/14 Follow-up type: In-patient    Tonna Corner 03/13/2014, 4:57 PM

## 2014-03-14 LAB — CBC
HCT: 17.2 % — ABNORMAL LOW (ref 36.0–46.0)
Hemoglobin: 5.9 g/dL — CL (ref 12.0–15.0)
MCH: 32.1 pg (ref 26.0–34.0)
MCHC: 34.3 g/dL (ref 30.0–36.0)
MCV: 93.5 fL (ref 78.0–100.0)
Platelets: 160 10*3/uL (ref 150–400)
RBC: 1.84 MIL/uL — ABNORMAL LOW (ref 3.87–5.11)
RDW: 13.5 % (ref 11.5–15.5)
WBC: 10.2 10*3/uL (ref 4.0–10.5)

## 2014-03-14 LAB — PREPARE RBC (CROSSMATCH)

## 2014-03-14 MED ORDER — DIPHENHYDRAMINE HCL 50 MG/ML IJ SOLN
25.0000 mg | Freq: Once | INTRAMUSCULAR | Status: AC
  Administered 2014-03-14: 25 mg via INTRAVENOUS
  Filled 2014-03-14: qty 1

## 2014-03-14 MED ORDER — SODIUM CHLORIDE 0.9 % IV SOLN: Freq: Once | INTRAVENOUS | Status: DC

## 2014-03-14 MED ORDER — ACETAMINOPHEN 325 MG PO TABS
650.0000 mg | ORAL_TABLET | Freq: Once | ORAL | Status: AC
  Administered 2014-03-14: 650 mg via ORAL
  Filled 2014-03-14: qty 2

## 2014-03-14 NOTE — Progress Notes (Signed)
House coverage unable to obtain IV site. CRNA notified. CRNA attempting to place IV site.

## 2014-03-14 NOTE — Progress Notes (Signed)
Subjective: Postpartum Day 2: Cesarean Delivery Patient reports tolerating PO, + flatus and no problems voiding.   Denies orthostatic symptoms  Objective: Vital signs in last 24 hours: Temp:  [98.2 F (36.8 C)-98.3 F (36.8 C)] 98.2 F (36.8 C) (10/23 0647) Pulse Rate:  [63-83] 63 (10/23 0647) Resp:  [16-17] 16 (10/23 0647) BP: (108-117)/(58-59) 117/59 mmHg (10/23 2979) More than adequate UOP  Physical Exam:  General: alert, cooperative and appears stated age Lochia: appropriate Uterine Fundus: firm Incision: healing well, no significant drainage, no dehiscence DVT Evaluation: No evidence of DVT seen on physical exam. Negative Homan's sign. No cords or calf tenderness.   Recent Labs  03/13/14 0646 03/14/14 0540  HGB 6.9* 5.9*  HCT 20.1* 17.2*    Assessment/Plan: Status post Cesarean section. Doing well postoperatively.  Continue current care. ABL anemia-Continue iron.  Recommend blood transfusion.  Patient counseled re: risk of tx reaction and viral transmission.  She is undecided and I will readdress in a couple of hours.  Brihana Quickel, [place] 03/14/2014, 8:29 AM

## 2014-03-14 NOTE — Progress Notes (Signed)
CRNA started 20 gauge IV in Right [facility]  After 2 attempts.

## 2014-03-14 NOTE — Lactation Note (Signed)
This note was copied from the chart of Kerri Yu. Lactation Consultation Note  Patient Name: Kerri Yu [id number] Date: 03/14/2014 Reason for consult: Follow-up assessment;Difficult latch;Infant weight loss Mom teary this am, Baby is not latching well, still sleepy at the breast. Parents are supplementing. Mom is pumping receiving few drops of colostrum from left breast this visit. RN reports DEBP not working correctly, LC adjusted gasket and pump began to work well. LC assisted Mom with latching baby at this visit, baby sleepy but with using breast compression and continued stimulation baby did latch and breastfeed for 15 minutes. LC reviewed a feeding plan with parents to work baby to breast and encourage Mom's milk. Advised to BF with feeding ques but at least every 3 hours. Advised to continue to supplement till baby is consistently latching and weight loss (at 9% now) stabilizes. Advised to increase supplements to 18-25 ml today with each feeding. Mom to post pump every 3 hours on preemie setting for 15 minutes. Baby circumcision to be done tomorrow per nursery. Encouraged to call for assist, questions or concerns.   Maternal Data    Feeding Feeding Type: Breast Fed Length of feed: 15 min  LATCH Score/Interventions Latch: Repeated attempts needed to sustain latch, nipple held in mouth throughout feeding, stimulation needed to elicit sucking reflex. Intervention(s): Adjust position;Assist with latch;Breast massage;Breast compression  Audible Swallowing: None  Type of Nipple: Everted at rest and after stimulation  Comfort (Breast/Nipple): Soft / non-tender     Hold (Positioning): Assistance needed to correctly position infant at breast and maintain latch. Intervention(s): Breastfeeding basics reviewed;Support Pillows;Position options;Skin to skin  LATCH Score: 6  Lactation Tools Discussed/Used Tools: Pump;Nipple [place] Fuel;Feeding cup Nipple shield size: 20 Breast  pump type: Double-Electric Breast Pump   Consult Status Consult Status: Follow-up Date: 03/14/14 Follow-up type: In-patient    [name] 03/14/2014, 9:08 AM

## 2014-03-14 NOTE — Progress Notes (Signed)
Blood transfusion not started. Blood bank did not notify of blood being ready. Blood bank called.  Blood ready. Pt need 20 gauge IV placed. House coverage notified. IV to be placed shortly.

## 2014-03-15 LAB — TYPE AND SCREEN
ABO/RH(D): AB POS
Antibody Screen: NEGATIVE
UNIT DIVISION: 0
Unit division: 0

## 2014-03-15 LAB — HEMOGLOBIN AND HEMATOCRIT, BLOOD
HEMATOCRIT: 25.2 % — AB (ref 36.0–46.0)
HEMOGLOBIN: 8.9 g/dL — AB (ref 12.0–15.0)

## 2014-03-15 MED ORDER — OXYCODONE-ACETAMINOPHEN 5-325 MG PO TABS: 1.0000 | ORAL_TABLET | ORAL | Status: DC | PRN

## 2014-03-15 MED ORDER — IBUPROFEN 600 MG PO TABS: 600.0000 mg | ORAL_TABLET | Freq: Four times a day (QID) | ORAL | Status: DC

## 2014-03-15 MED ORDER — FERROUS SULFATE 325 (65 FE) MG PO TABS
325.0000 mg | ORAL_TABLET | Freq: Two times a day (BID) | ORAL | Status: DC
Start: 2014-03-15 — End: 2016-11-30

## 2014-03-15 MED ORDER — DOCUSATE SODIUM 100 MG PO CAPS: 100.0000 mg | ORAL_CAPSULE | Freq: Two times a day (BID) | ORAL | Status: DC

## 2014-03-15 NOTE — Progress Notes (Signed)
Subjective: Postpartum Day 3: Cesarean Delivery Patient reports tolerating PO, + flatus and no problems voiding.  Feeling much better after 2 u PRBCs.  Wants circ today.  Objective: Vital signs in last 24 hours: Temp:  [97.9 F (36.6 C)-98.4 F (36.9 C)] 97.9 F (36.6 C) (10/24 0544) Pulse Rate:  [50-78] 51 (10/24 0544) Resp:  [16-18] 16 (10/24 0120) BP: (130-156)/(69-84) 156/83 mmHg (10/24 0544) SpO2:  [99 %] 99 % (10/24 0544)  Physical Exam:  General: alert, cooperative and appears stated age Lochia: appropriate Uterine Fundus: firm Incision: healing well, no significant drainage, no dehiscence DVT Evaluation: No evidence of DVT seen on physical exam. Negative Homan's sign. No cords or calf tenderness.   Recent Labs  03/14/14 0540 03/15/14 0610  HGB 5.9* 8.9*  HCT 17.2* 25.2*    Assessment/Plan: Status post Cesarean section. Doing well postoperatively.  Discharge home with standard precautions and return to clinic in 4-6 weeks. ABL anemia (s/p PPH)-s/p 2u PRBCs.  Feeling improved. Counseled for circ including risk of bleeding, infection, and scarring.  All questions were answered and the patient wishes to proceed.  Oveta Idris, [place] 03/15/2014, 9:25 AM

## 2014-03-15 NOTE — Lactation Note (Signed)
This note was copied from the chart of Kerri Yu. Lactation Consultation Note  Follow up visit done.  Parents feel like much progress has been made with breastfeeding.  Mom gave mainly bottles yesterday during her blood transfusion.  Baby has just returned from circumcision and sleepy.  Baby placed skin to skin with mom using football hold.  Parents shown correct technique for positioning and breast compression.  Baby opened and latched twice well but fell asleep after 1 minute.  Waking techniques done but baby too sleepy to feed.  Instructed mom to pump and call out for assist with feeding cues.  Patient Name: Kerri Mianna Iezzi [id number] Date: 03/15/2014 Reason for consult: Follow-up assessment   Maternal Data    Feeding Feeding Type: Breast Fed  LATCH Score/Interventions                      Lactation Tools Discussed/Used     Consult Status      Carla Drape S 03/15/2014, 12:00 PM

## 2014-03-15 NOTE — Discharge Summary (Signed)
Obstetric Discharge Summary Reason for Admission: induction of labor Prenatal Procedures: none Intrapartum Procedures: cesarean: low cervical, transverse Postpartum Procedures: transfusion 2u PRBCs Complications-Operative and Postpartum: hemorrhage Hemoglobin  Date Value Ref Range Status  03/15/2014 8.9* 12.0 - 15.0 g/dL Final     REPEATED TO VERIFY     DELTA CHECK NOTED     POST TRANSFUSION SPECIMEN     HCT  Date Value Ref Range Status  03/15/2014 25.2* 36.0 - 46.0 % Final    Physical Exam:  General: alert, cooperative and appears stated age 34: appropriate Uterine Fundus: firm Incision: healing well, no significant drainage, no dehiscence DVT Evaluation: No evidence of DVT seen on physical exam. Negative Homan's sign. No cords or calf tenderness.  Discharge Diagnoses: Term Pregnancy-delivered  Discharge Information: Date: 03/15/2014 Activity: pelvic rest Diet: routine Medications: PNV, Ibuprofen and Percocet Condition: stable Instructions: refer to practice specific booklet Discharge to: home   Newborn Data: Live born female  Birth Weight: 9 lb 9.4 oz (4349 g) APGAR: 2, 9  Home with mother.  Zain Bingman, [place] 03/15/2014, 9:31 AM

## 2014-03-15 NOTE — Discharge Instructions (Signed)
Call MD for T>100.4, heavy vaginal bleeding, severe abdominal pain, intractable nausea and/or vomiting, or respiratory distress.  Call office to schedule incision check in 1 week.  No driving while taking narcotics.  Pelvic rest x 6 weeks.   °

## 2014-03-15 NOTE — Progress Notes (Signed)
Notified Dr Lynnette Caffey of patients BP results, she stated ok for discharge.

## 2014-03-20 ENCOUNTER — Inpatient Hospital Stay (HOSPITAL_COMMUNITY): Admission: RE | Admit: 2014-03-20 | Payer: BC Managed Care – PPO | Source: Ambulatory Visit

## 2014-03-24 ENCOUNTER — Encounter (HOSPITAL_COMMUNITY): Payer: Self-pay | Admitting: Obstetrics & Gynecology

## 2014-04-24 ENCOUNTER — Other Ambulatory Visit: Payer: Self-pay | Admitting: Obstetrics & Gynecology

## 2014-04-25 LAB — CYTOLOGY - PAP

## 2016-05-04 LAB — OB RESULTS CONSOLE GC/CHLAMYDIA
Chlamydia: NEGATIVE
Gonorrhea: NEGATIVE

## 2016-05-04 LAB — OB RESULTS CONSOLE RUBELLA ANTIBODY, IGM: Rubella: IMMUNE

## 2016-05-04 LAB — OB RESULTS CONSOLE ANTIBODY SCREEN: Antibody Screen: NEGATIVE

## 2016-05-04 LAB — OB RESULTS CONSOLE ABO/RH: RH Type: POSITIVE

## 2016-05-04 LAB — OB RESULTS CONSOLE HEPATITIS B SURFACE ANTIGEN: Hepatitis B Surface Ag: NEGATIVE

## 2016-05-04 LAB — OB RESULTS CONSOLE HIV ANTIBODY (ROUTINE TESTING): HIV: NONREACTIVE

## 2016-05-04 LAB — OB RESULTS CONSOLE RPR: RPR: NONREACTIVE

## 2016-11-10 LAB — OB RESULTS CONSOLE GBS: STREP GROUP B AG: NEGATIVE

## 2016-11-18 ENCOUNTER — Encounter (HOSPITAL_COMMUNITY): Payer: Self-pay

## 2016-12-02 ENCOUNTER — Encounter (HOSPITAL_COMMUNITY)
Admission: RE | Admit: 2016-12-02 | Discharge: 2016-12-02 | Disposition: A | Discharge status: Home / Self Care | Payer: BC Managed Care – PPO | Source: Ambulatory Visit | Attending: Obstetrics & Gynecology | Admitting: Obstetrics & Gynecology

## 2016-12-02 HISTORY — DX: Supervision of pregnancy with other poor reproductive or obstetric history, unspecified trimester: O09.299

## 2016-12-02 HISTORY — DX: Encounter for other specified aftercare: Z51.89

## 2016-12-02 HISTORY — DX: Other complications of anesthesia, initial encounter: T88.59XA

## 2016-12-02 HISTORY — DX: Adverse effect of unspecified anesthetic, initial encounter: T41.45XA

## 2016-12-02 HISTORY — DX: Anxiety disorder, unspecified: F41.9

## 2016-12-02 LAB — CBC
HCT: 39 % (ref 36.0–46.0)
Hemoglobin: 13.6 g/dL (ref 12.0–15.0)
MCH: 32.3 pg (ref 26.0–34.0)
MCHC: 34.9 g/dL (ref 30.0–36.0)
MCV: 92.6 fL (ref 78.0–100.0)
PLATELETS: 287 10*3/uL (ref 150–400)
RBC: 4.21 MIL/uL (ref 3.87–5.11)
RDW: 13.6 % (ref 11.5–15.5)
WBC: 9.2 10*3/uL (ref 4.0–10.5)

## 2016-12-02 LAB — TYPE AND SCREEN
ABO/RH(D): AB POS
ANTIBODY SCREEN: NEGATIVE

## 2016-12-02 NOTE — Patient Instructions (Signed)
Jansen  12/02/2016   Your procedure is scheduled on:  12/05/2016  Enter through the Main Entrance of [facility] up the phone at the desk and dial [phone].   Call this number if you have problems the morning of surgery: [phone]   Remember:   Do not eat food:After Midnight.  Do not drink clear liquids: 6 Hours before arrival.  Take these medicines the morning of surgery with A SIP OF WATER: na  Do not wear jewelry, make-up or nail polish.  Do not wear lotions, powders, or perfumes. Do not wear deodorant.  Do not shave 48 hours prior to surgery.  Do not bring valuables to the hospital.  [facility] is not   responsible for any belongings or valuables brought to the hospital.  Contacts, dentures or bridgework may not be worn into surgery.  Leave suitcase in the car. After surgery it may be brought to your room.  For patients admitted to the hospital, checkout time is 11:00 AM the day of              discharge.   Patients discharged the day of surgery will not be allowed to drive             home.  Name and phone number of your driver: na  Special Instructions:   N/A   Please read over the following fact sheets that you were given:   Surgical Site Infection Prevention

## 2016-12-02 NOTE — H&P (Signed)
Kerri Yu is a 36 y.o. female presenting for repeat C/S; declines TOLAC.  Antepartum course complicated by h/o postpartum hemorrhage requiring transfusion and LGA (9#9).  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  GBS negative.   OB History    Gravida Para Term Preterm AB Living   2 1 1  0 0 1   SAB TAB Ectopic Multiple Live Births   0 0 0 0 1     Past Medical History:  Diagnosis Date  . Anxiety    hx panic attacks took zoloft in the past  . Blood transfusion without reported diagnosis   . Complication of anesthesia   . History of postpartum hemorrhage, currently pregnant   . Medical history non-contributory    Past Surgical History:  Procedure Laterality Date  . CESAREAN SECTION N/A 03/12/2014   Procedure: CESAREAN SECTION;  Surgeon: [name], DO;  Location: [facility];  Service: Obstetrics;  Laterality: N/A;  . NO PAST SURGERIES     Family History: family history includes Diabetes in her paternal grandmother; Hypertension in her father; Kidney disease in her mother; Thyroid disease in her mother. Social History:  reports that she has never smoked. She has never used smokeless tobacco. She reports that she does not drink alcohol or use drugs.     Maternal Diabetes: No Genetic Screening: Normal Maternal Ultrasounds/Referrals: Normal Fetal Ultrasounds or other Referrals:  None Maternal Substance Abuse:  No Significant Maternal Medications:  None Significant Maternal Lab Results:  Lab values include: Group B Strep negative Other Comments:  None  ROS Maternal Medical History:  Prenatal complications: no prenatal complications Prenatal Complications - Diabetes: none.      Last menstrual period 03/02/2016, unknown if currently breastfeeding. Maternal Exam:  Abdomen: Patient reports no abdominal tenderness. Surgical scars: low transverse.   Fundal height is S>D.   Estimated fetal weight is 9#.       Physical Exam  Constitutional: She is oriented to person,  place, and time. She appears well-developed and well-nourished.  GI: Soft. There is no rebound and no guarding.  Neurological: She is alert and oriented to person, place, and time.  Skin: Skin is warm and dry.  Psychiatric: She has a normal mood and affect. Her behavior is normal.    Prenatal labs: ABO, Rh: AB/Positive/-- (12/13 0000) Antibody: Negative (12/13 0000) Rubella: Immune (12/13 0000) RPR: Nonreactive (12/13 0000)  HBsAg: Negative (12/13 0000)  HIV: Non-reactive (12/13 0000)  GBS: Negative (06/21 0000)   Assessment/Plan: 36yo G2P1001 at 39w for repeat C/S -Patient has been counseled re: risk of bleeding, infection, scarring and damage to surrounding structures.  All questions were answered and the patient wishes to proceed with repeat C/S.   Kerri Yu 12/02/2016, 1:13 PM

## 2016-12-03 LAB — RPR: RPR: NONREACTIVE

## 2016-12-04 MED ORDER — CEFAZOLIN SODIUM-DEXTROSE 2-4 GM/100ML-% IV SOLN
2.0000 g | INTRAVENOUS | Status: AC
  Administered 2016-12-05: 2 g via INTRAVENOUS
  Filled 2016-12-04: qty 100

## 2016-12-05 ENCOUNTER — Inpatient Hospital Stay (HOSPITAL_COMMUNITY)
Admission: RE | Admit: 2016-12-05 | Discharge: 2016-12-07 | DRG: 766 | Disposition: A | Discharge status: Home / Self Care | Payer: BC Managed Care – PPO | Source: Ambulatory Visit | Attending: Obstetrics & Gynecology | Admitting: Obstetrics & Gynecology

## 2016-12-05 ENCOUNTER — Inpatient Hospital Stay (HOSPITAL_COMMUNITY): Payer: BC Managed Care – PPO | Admitting: Anesthesiology

## 2016-12-05 ENCOUNTER — Encounter (HOSPITAL_COMMUNITY): Payer: Self-pay | Admitting: *Deleted

## 2016-12-05 ENCOUNTER — Encounter (HOSPITAL_COMMUNITY)
Admission: RE | Disposition: A | Discharge status: Home / Self Care | Payer: Self-pay | Source: Ambulatory Visit | Attending: Obstetrics & Gynecology

## 2016-12-05 DIAGNOSIS — Z3A39 39 weeks gestation of pregnancy: Secondary | ICD-10-CM

## 2016-12-05 DIAGNOSIS — O34211 Maternal care for low transverse scar from previous cesarean delivery: Secondary | ICD-10-CM | POA: Diagnosis present

## 2016-12-05 DIAGNOSIS — O3413 Maternal care for benign tumor of corpus uteri, third trimester: Secondary | ICD-10-CM | POA: Diagnosis present

## 2016-12-05 DIAGNOSIS — O3663X0 Maternal care for excessive fetal growth, third trimester, not applicable or unspecified: Secondary | ICD-10-CM | POA: Diagnosis present

## 2016-12-05 DIAGNOSIS — Z98891 History of uterine scar from previous surgery: Secondary | ICD-10-CM

## 2016-12-05 DIAGNOSIS — D259 Leiomyoma of uterus, unspecified: Secondary | ICD-10-CM | POA: Diagnosis present

## 2016-12-05 DIAGNOSIS — O321XX0 Maternal care for breech presentation, not applicable or unspecified: Secondary | ICD-10-CM | POA: Diagnosis present

## 2016-12-05 SURGERY — Surgical Case
Anesthesia: Spinal | Wound class: Clean Contaminated

## 2016-12-05 MED ORDER — MEPERIDINE HCL 25 MG/ML IJ SOLN
6.2500 mg | INTRAMUSCULAR | Status: DC | PRN
  Administered 2016-12-05: 6.25 mg via INTRAVENOUS

## 2016-12-05 MED ORDER — KETOROLAC TROMETHAMINE 30 MG/ML IJ SOLN
INTRAMUSCULAR | Status: AC
  Filled 2016-12-05: qty 1

## 2016-12-05 MED ORDER — NALBUPHINE SYRINGE 5 MG/0.5 ML: 5.0000 mg | INJECTION | Freq: Once | INTRAMUSCULAR | Status: DC | PRN

## 2016-12-05 MED ORDER — MENTHOL 3 MG MT LOZG: 1.0000 | LOZENGE | OROMUCOSAL | Status: DC | PRN

## 2016-12-05 MED ORDER — KETOROLAC TROMETHAMINE 30 MG/ML IJ SOLN: 30.0000 mg | Freq: Four times a day (QID) | INTRAMUSCULAR | Status: AC | PRN

## 2016-12-05 MED ORDER — ACETAMINOPHEN 325 MG PO TABS: 650.0000 mg | ORAL_TABLET | ORAL | Status: DC | PRN

## 2016-12-05 MED ORDER — ONDANSETRON HCL 4 MG/2ML IJ SOLN
INTRAMUSCULAR | Status: AC
  Filled 2016-12-05: qty 2

## 2016-12-05 MED ORDER — PHENYLEPHRINE 40 MCG/ML (10ML) SYRINGE FOR IV PUSH (FOR BLOOD PRESSURE SUPPORT)
PREFILLED_SYRINGE | INTRAVENOUS | Status: AC
  Filled 2016-12-05: qty 20

## 2016-12-05 MED ORDER — SIMETHICONE 80 MG PO CHEW
80.0000 mg | CHEWABLE_TABLET | Freq: Three times a day (TID) | ORAL | Status: DC
  Administered 2016-12-05 – 2016-12-07 (×5): 80 mg via ORAL
  Filled 2016-12-05 (×4): qty 1

## 2016-12-05 MED ORDER — ACETAMINOPHEN 500 MG PO TABS
1000.0000 mg | ORAL_TABLET | Freq: Four times a day (QID) | ORAL | Status: AC
  Administered 2016-12-05 – 2016-12-06 (×3): 1000 mg via ORAL
  Filled 2016-12-05 (×3): qty 2

## 2016-12-05 MED ORDER — PHENYLEPHRINE HCL 10 MG/ML IJ SOLN
INTRAMUSCULAR | Status: DC | PRN
  Administered 2016-12-05: 80 ug via INTRAVENOUS

## 2016-12-05 MED ORDER — SOD CITRATE-CITRIC ACID 500-334 MG/5ML PO SOLN
ORAL | Status: AC
  Filled 2016-12-05: qty 15

## 2016-12-05 MED ORDER — MEPERIDINE HCL 25 MG/ML IJ SOLN
INTRAMUSCULAR | Status: AC
  Filled 2016-12-05: qty 1

## 2016-12-05 MED ORDER — ONDANSETRON HCL 4 MG/2ML IJ SOLN: 4.0000 mg | Freq: Three times a day (TID) | INTRAMUSCULAR | Status: DC | PRN

## 2016-12-05 MED ORDER — SOD CITRATE-CITRIC ACID 500-334 MG/5ML PO SOLN
30.0000 mL | Freq: Once | ORAL | Status: AC
  Administered 2016-12-05: 30 mL via ORAL

## 2016-12-05 MED ORDER — FAMOTIDINE 20 MG PO TABS
20.0000 mg | ORAL_TABLET | Freq: Once | ORAL | Status: AC
  Administered 2016-12-05: 20 mg via ORAL
  Filled 2016-12-05: qty 1

## 2016-12-05 MED ORDER — OXYTOCIN 10 UNIT/ML IJ SOLN
INTRAMUSCULAR | Status: DC | PRN
  Administered 2016-12-05: 40 [IU] via INTRAVENOUS

## 2016-12-05 MED ORDER — SENNOSIDES-DOCUSATE SODIUM 8.6-50 MG PO TABS
2.0000 | ORAL_TABLET | ORAL | Status: DC
  Administered 2016-12-06 (×2): 2 via ORAL
  Filled 2016-12-05 (×2): qty 2

## 2016-12-05 MED ORDER — FENTANYL CITRATE (PF) 100 MCG/2ML IJ SOLN
INTRAMUSCULAR | Status: DC | PRN
  Administered 2016-12-05: 10 ug via INTRATHECAL

## 2016-12-05 MED ORDER — DIPHENHYDRAMINE HCL 25 MG PO CAPS: 25.0000 mg | ORAL_CAPSULE | Freq: Four times a day (QID) | ORAL | Status: DC | PRN

## 2016-12-05 MED ORDER — PHENYLEPHRINE 8 MG IN D5W 100 ML (0.08MG/ML) PREMIX OPTIME
INJECTION | INTRAVENOUS | Status: DC | PRN
  Administered 2016-12-05: 60 ug/min via INTRAVENOUS

## 2016-12-05 MED ORDER — NALOXONE HCL 0.4 MG/ML IJ SOLN: 0.4000 mg | INTRAMUSCULAR | Status: DC | PRN

## 2016-12-05 MED ORDER — WITCH HAZEL-GLYCERIN EX PADS: 1.0000 "application " | MEDICATED_PAD | CUTANEOUS | Status: DC | PRN

## 2016-12-05 MED ORDER — ONDANSETRON HCL 4 MG/2ML IJ SOLN
INTRAMUSCULAR | Status: DC | PRN
  Administered 2016-12-05: 4 mg via INTRAVENOUS

## 2016-12-05 MED ORDER — NALOXONE HCL 2 MG/2ML IJ SOSY
1.0000 ug/kg/h | PREFILLED_SYRINGE | INTRAVENOUS | Status: DC | PRN
  Filled 2016-12-05: qty 2

## 2016-12-05 MED ORDER — SODIUM CHLORIDE 0.9% FLUSH: 3.0000 mL | INTRAVENOUS | Status: DC | PRN

## 2016-12-05 MED ORDER — NALBUPHINE SYRINGE 5 MG/0.5 ML: 5.0000 mg | INJECTION | INTRAMUSCULAR | Status: DC | PRN

## 2016-12-05 MED ORDER — SIMETHICONE 80 MG PO CHEW
80.0000 mg | CHEWABLE_TABLET | ORAL | Status: DC
  Administered 2016-12-06 (×2): 80 mg via ORAL
  Filled 2016-12-05 (×2): qty 1

## 2016-12-05 MED ORDER — DEXAMETHASONE SODIUM PHOSPHATE 4 MG/ML IJ SOLN
INTRAMUSCULAR | Status: DC | PRN
  Administered 2016-12-05: 4 mg via INTRAVENOUS

## 2016-12-05 MED ORDER — DIPHENHYDRAMINE HCL 50 MG/ML IJ SOLN: 12.5000 mg | INTRAMUSCULAR | Status: DC | PRN

## 2016-12-05 MED ORDER — DIBUCAINE 1 % RE OINT: 1.0000 "application " | TOPICAL_OINTMENT | RECTAL | Status: DC | PRN

## 2016-12-05 MED ORDER — SCOPOLAMINE 1 MG/3DAYS TD PT72: 1.0000 | MEDICATED_PATCH | Freq: Once | TRANSDERMAL | Status: DC

## 2016-12-05 MED ORDER — DIPHENHYDRAMINE HCL 25 MG PO CAPS
25.0000 mg | ORAL_CAPSULE | ORAL | Status: DC | PRN
  Filled 2016-12-05: qty 1

## 2016-12-05 MED ORDER — FENTANYL CITRATE (PF) 100 MCG/2ML IJ SOLN
INTRAMUSCULAR | Status: AC
  Filled 2016-12-05: qty 2

## 2016-12-05 MED ORDER — BUPIVACAINE HCL (PF) 0.25 % IJ SOLN
INTRAMUSCULAR | Status: AC
  Filled 2016-12-05: qty 30

## 2016-12-05 MED ORDER — BUPIVACAINE IN DEXTROSE 0.75-8.25 % IT SOLN
INTRATHECAL | Status: DC | PRN
Start: 2016-12-05 — End: 2016-12-05
  Administered 2016-12-05: 1.6 mL via INTRATHECAL

## 2016-12-05 MED ORDER — LACTATED RINGERS IV SOLN
INTRAVENOUS | Status: DC
  Administered 2016-12-05 – 2016-12-06 (×2): via INTRAVENOUS

## 2016-12-05 MED ORDER — PHENYLEPHRINE 8 MG IN D5W 100 ML (0.08MG/ML) PREMIX OPTIME
INJECTION | INTRAVENOUS | Status: AC
  Filled 2016-12-05: qty 100

## 2016-12-05 MED ORDER — OXYTOCIN 10 UNIT/ML IJ SOLN
INTRAMUSCULAR | Status: AC
  Filled 2016-12-05: qty 4

## 2016-12-05 MED ORDER — IBUPROFEN 600 MG PO TABS
600.0000 mg | ORAL_TABLET | Freq: Four times a day (QID) | ORAL | Status: DC
  Administered 2016-12-06 – 2016-12-07 (×7): 600 mg via ORAL
  Filled 2016-12-05 (×7): qty 1

## 2016-12-05 MED ORDER — SIMETHICONE 80 MG PO CHEW: 80.0000 mg | CHEWABLE_TABLET | ORAL | Status: DC | PRN

## 2016-12-05 MED ORDER — OXYTOCIN 40 UNITS IN LACTATED RINGERS INFUSION - SIMPLE MED: 2.5000 [IU]/h | INTRAVENOUS | Status: AC

## 2016-12-05 MED ORDER — SCOPOLAMINE 1 MG/3DAYS TD PT72
MEDICATED_PATCH | TRANSDERMAL | Status: AC
  Filled 2016-12-05: qty 1

## 2016-12-05 MED ORDER — BUPIVACAINE IN DEXTROSE 0.75-8.25 % IT SOLN
INTRATHECAL | Status: AC
  Filled 2016-12-05: qty 2

## 2016-12-05 MED ORDER — TETANUS-DIPHTH-ACELL PERTUSSIS 5-2.5-18.5 LF-MCG/0.5 IM SUSP: 0.5000 mL | Freq: Once | INTRAMUSCULAR | Status: DC

## 2016-12-05 MED ORDER — COCONUT OIL OIL
1.0000 "application " | TOPICAL_OIL | Status: DC | PRN
  Filled 2016-12-05: qty 120

## 2016-12-05 MED ORDER — MORPHINE SULFATE (PF) 0.5 MG/ML IJ SOLN
INTRAMUSCULAR | Status: AC
  Filled 2016-12-05: qty 10

## 2016-12-05 MED ORDER — OXYCODONE-ACETAMINOPHEN 5-325 MG PO TABS
1.0000 | ORAL_TABLET | ORAL | Status: DC | PRN
  Administered 2016-12-06 – 2016-12-07 (×3): 1 via ORAL
  Filled 2016-12-05 (×3): qty 1

## 2016-12-05 MED ORDER — KETOROLAC TROMETHAMINE 30 MG/ML IJ SOLN
30.0000 mg | Freq: Four times a day (QID) | INTRAMUSCULAR | Status: AC | PRN
  Administered 2016-12-05: 30 mg via INTRAMUSCULAR

## 2016-12-05 MED ORDER — SCOPOLAMINE 1 MG/3DAYS TD PT72
1.0000 | MEDICATED_PATCH | Freq: Once | TRANSDERMAL | Status: DC
  Administered 2016-12-05: 1.5 mg via TRANSDERMAL

## 2016-12-05 MED ORDER — DEXAMETHASONE SODIUM PHOSPHATE 4 MG/ML IJ SOLN
INTRAMUSCULAR | Status: AC
  Filled 2016-12-05: qty 3

## 2016-12-05 MED ORDER — ZOLPIDEM TARTRATE 5 MG PO TABS: 5.0000 mg | ORAL_TABLET | Freq: Every evening | ORAL | Status: DC | PRN

## 2016-12-05 MED ORDER — LACTATED RINGERS IV SOLN
INTRAVENOUS | Status: DC
  Administered 2016-12-05 (×2): via INTRAVENOUS

## 2016-12-05 MED ORDER — MORPHINE SULFATE (PF) 0.5 MG/ML IJ SOLN
INTRAMUSCULAR | Status: DC | PRN
Start: 2016-12-05 — End: 2016-12-05
  Administered 2016-12-05: .2 mg via INTRATHECAL

## 2016-12-05 MED ORDER — OXYCODONE-ACETAMINOPHEN 5-325 MG PO TABS: 2.0000 | ORAL_TABLET | ORAL | Status: DC | PRN

## 2016-12-05 MED ORDER — PRENATAL MULTIVITAMIN CH
1.0000 | ORAL_TABLET | Freq: Every day | ORAL | Status: DC
  Administered 2016-12-06: 1 via ORAL
  Filled 2016-12-05: qty 1

## 2016-12-05 SURGICAL SUPPLY — 32 items
BENZOIN TINCTURE PRP APPL 2/3 (GAUZE/BANDAGES/DRESSINGS) ×3 IMPLANT
CHLORAPREP W/TINT 26ML (MISCELLANEOUS) ×3 IMPLANT
CLAMP CORD UMBIL (MISCELLANEOUS) IMPLANT
CLOSURE WOUND 1/2 X4 (GAUZE/BANDAGES/DRESSINGS) ×1
CLOTH BEACON ORANGE TIMEOUT ST (SAFETY) ×3 IMPLANT
DERMABOND ADVANCED (GAUZE/BANDAGES/DRESSINGS)
DERMABOND ADVANCED .7 DNX12 (GAUZE/BANDAGES/DRESSINGS) IMPLANT
DRSG OPSITE POSTOP 4X10 (GAUZE/BANDAGES/DRESSINGS) ×3 IMPLANT
ELECT REM PT RETURN 9FT ADLT (ELECTROSURGICAL) ×3
ELECTRODE REM PT RTRN 9FT ADLT (ELECTROSURGICAL) ×1 IMPLANT
EXTRACTOR VACUUM KIWI (MISCELLANEOUS) IMPLANT
GLOVE BIO SURGEON STRL SZ 6 (GLOVE) ×3 IMPLANT
GLOVE BIOGEL PI IND STRL 6 (GLOVE) ×2 IMPLANT
GLOVE BIOGEL PI IND STRL 7.0 (GLOVE) ×1 IMPLANT
GLOVE BIOGEL PI INDICATOR 6 (GLOVE) ×4
GLOVE BIOGEL PI INDICATOR 7.0 (GLOVE) ×2
GOWN STRL REUS W/TWL LRG LVL3 (GOWN DISPOSABLE) ×6 IMPLANT
KIT ABG SYR 3ML LUER SLIP (SYRINGE) ×3 IMPLANT
NEEDLE HYPO 25X5/8 SAFETYGLIDE (NEEDLE) ×3 IMPLANT
NS IRRIG 1000ML POUR BTL (IV SOLUTION) ×3 IMPLANT
PACK C SECTION WH (CUSTOM PROCEDURE TRAY) ×3 IMPLANT
PAD OB MATERNITY 4.3X12.25 (PERSONAL CARE ITEMS) ×3 IMPLANT
PENCIL SMOKE EVAC W/HOLSTER (ELECTROSURGICAL) ×3 IMPLANT
STRIP CLOSURE SKIN 1/2X4 (GAUZE/BANDAGES/DRESSINGS) ×2 IMPLANT
SUT CHROMIC 0 CTX 36 (SUTURE) ×9 IMPLANT
SUT MON AB 2-0 CT1 27 (SUTURE) ×3 IMPLANT
SUT PDS AB 0 CT1 27 (SUTURE) IMPLANT
SUT PLAIN 0 NONE (SUTURE) IMPLANT
SUT VIC AB 0 CT1 36 (SUTURE) IMPLANT
SUT VIC AB 4-0 KS 27 (SUTURE) IMPLANT
TOWEL OR 17X24 6PK STRL BLUE (TOWEL DISPOSABLE) ×3 IMPLANT
TRAY FOLEY BAG SILVER LF 14FR (SET/KITS/TRAYS/PACK) IMPLANT

## 2016-12-05 NOTE — Transfer of Care (Signed)
Immediate Anesthesia Transfer of Care Note  Patient: Kerri Yu  Procedure(s) Performed: Procedure(s) with comments: CESAREAN SECTION (N/A) - Repeat edc 12/12/16 allergic to biaxin Jill Side, RNFA  Patient Location: PACU  Anesthesia Type:Spinal  Level of Consciousness: awake, alert  and oriented  Airway & Oxygen Therapy: Patient Spontanous Breathing  Post-op Assessment: Report given to RN and Post -op Vital signs reviewed and stable  Post vital signs: Reviewed and stable HR 80, RR 16, SaO2 99%, BP 108/52  Last Vitals:  Vitals:   12/05/16 1200  BP: 114/82  Pulse: 97  Resp: 18  Temp: 36.8 C    Last Pain:  Vitals:   12/05/16 1200  TempSrc: Oral      Patients Stated Pain Goal: 4 (96/75/91 6384)  Complications: No apparent anesthesia complications

## 2016-12-05 NOTE — Anesthesia Procedure Notes (Signed)
Spinal  Patient location during procedure: OR Start time: 12/05/2016 1:04 PM End time: 12/05/2016 1:06 PM Staffing Anesthesiologist: Catalina Gravel Performed: anesthesiologist  Preanesthetic Checklist Completed: patient identified, surgical consent, pre-op evaluation, timeout performed, IV checked, risks and benefits discussed and monitors and equipment checked Spinal Block Patient position: sitting Prep: site prepped and draped and DuraPrep Patient monitoring: continuous pulse ox and blood pressure Approach: midline Location: L3-4 Injection technique: single-shot Needle Needle type: Pencan  Needle gauge: 25 G Assessment Sensory level: T4 Additional Notes Functioning IV was confirmed and monitors were applied. Sterile prep and drape, including hand hygiene, mask and sterile gloves were used. The patient was positioned and the spine was prepped. The skin was anesthetized with lidocaine.  Free flow of clear CSF was obtained prior to injecting local anesthetic into the CSF.  The spinal needle aspirated freely following injection.  The needle was carefully withdrawn.  The patient tolerated the procedure well. Consent was obtained prior to procedure with all questions answered and concerns addressed. Risks including but not limited to bleeding, infection, nerve damage, paralysis, failed block, inadequate analgesia, allergic reaction, high spinal, itching and headache were discussed and the patient wished to proceed.   Hoy Morn, MD

## 2016-12-05 NOTE — Anesthesia Postprocedure Evaluation (Signed)
Anesthesia Post Note  Patient: Kerri Yu  Procedure(s) Performed: Procedure(s) (LRB): CESAREAN SECTION (N/A)     Patient location during evaluation: PACU Anesthesia Type: Spinal Level of consciousness: oriented and awake and alert Pain management: pain level controlled Vital Signs Assessment: post-procedure vital signs reviewed and stable Respiratory status: spontaneous breathing, respiratory function stable and patient connected to nasal cannula oxygen Cardiovascular status: blood pressure returned to baseline and stable Postop Assessment: no headache, no backache, spinal receding, patient able to bend at knees and no signs of nausea or vomiting Anesthetic complications: no    Last Vitals:  Vitals:   12/05/16 1408 12/05/16 1415  BP: (!) 108/52   Pulse: 79 71  Resp: 16 13  Temp: (!) 36.4 C     Last Pain:  Vitals:   12/05/16 1200  TempSrc: Oral   Pain Goal: Patients Stated Pain Goal: 4 (12/05/16 1200)               Catalina Gravel

## 2016-12-05 NOTE — Anesthesia Preprocedure Evaluation (Signed)
Anesthesia Evaluation  Patient identified by MRN, date of birth, ID band Patient awake    Reviewed: Allergy & Precautions, NPO status , Patient's Chart, lab work & pertinent test results  Airway Mallampati: II  TM Distance: >3 FB Neck ROM: Full    Dental  (+) Teeth Intact, Dental Advisory Given   Pulmonary neg pulmonary ROS,    Pulmonary exam normal breath sounds clear to auscultation       Cardiovascular negative cardio ROS Normal cardiovascular exam Rhythm:Regular Rate:Normal     Neuro/Psych PSYCHIATRIC DISORDERS Anxiety negative neurological ROS     GI/Hepatic negative GI ROS, Neg liver ROS,   Endo/Other  negative endocrine ROS  Renal/GU negative Renal ROS     Musculoskeletal negative musculoskeletal ROS (+)   Abdominal   Peds  Hematology negative hematology ROS (+) Plt 287k    Anesthesia Other Findings Day of surgery medications reviewed with the patient.  Reproductive/Obstetrics (+) Pregnancy H/o postpartum hemorrhage requiring transfusion and LGA                             Anesthesia Physical Anesthesia Plan  ASA: II  Anesthesia Plan: Spinal   Post-op Pain Management:    Induction:   PONV Risk Score and Plan: 2 and Ondansetron and Scopolamine patch - Pre-op  Airway Management Planned:   Additional Equipment:   Intra-op Plan:   Post-operative Plan:   Informed Consent: I have reviewed the patients History and Physical, chart, labs and discussed the procedure including the risks, benefits and alternatives for the proposed anesthesia with the patient or authorized representative who has indicated his/her understanding and acceptance.   Dental advisory given  Plan Discussed with: CRNA, Anesthesiologist and Surgeon  Anesthesia Plan Comments: (Discussed risks and benefits of and differences between spinal and general. Discussed risks of spinal including headache,  backache, failure, bleeding, infection, and nerve damage. Patient consents to spinal. Questions answered. Coagulation studies and platelet count acceptable.)        Anesthesia Quick Evaluation

## 2016-12-05 NOTE — Lactation Note (Signed)
This note was copied from a baby's chart. Lactation Consultation Note  Patient Name: Kerri Yu Today's Date: 12/05/2016 Reason for consult: Initial assessment  Visit at 7 hours of age. Mom reports good feedings and denies pain with latching.  Baby is STS with mom now. Mom demonstrated hand expression with small drops expressed,  Baby began rooting.  LC assisted with cross cradle latch on left breast.  Baby sucked a few times and fell asleep.   [facility] LC resources given and discussed.  Encouraged to feed with early cues on demand 8-12x/day.  Early newborn behavior discussed.    Mom to call for assist as needed.    Maternal Data Has patient been taught Hand Expression?: Yes Does the patient have breastfeeding experience prior to this delivery?: Yes  Feeding Feeding Type: Breast Fed Length of feed:  (few sucks baby sleepy)  LATCH Score/Interventions Latch: Repeated attempts needed to sustain latch, nipple held in mouth throughout feeding, stimulation needed to elicit sucking reflex. Intervention(s): Adjust position;Assist with latch;Breast massage;Breast compression  Audible Swallowing: None  Type of Nipple: Everted at rest and after stimulation  Comfort (Breast/Nipple): Soft / non-tender     Hold (Positioning): Assistance needed to correctly position infant at breast and maintain latch. Intervention(s): Breastfeeding basics reviewed;Support Pillows;Position options;Skin to skin  LATCH Score: 6  Lactation Tools Discussed/Used     Consult Status Consult Status: Follow-up Date: 12/06/16 Follow-up type: In-patient    Kendell Bane Justine Null 12/05/2016, 9:13 PM

## 2016-12-05 NOTE — Progress Notes (Signed)
No change to H&P.  [name], dO

## 2016-12-05 NOTE — Plan of Care (Signed)
Problem: Respiratory: Goal: Ability to maintain adequate ventilation will improve Outcome: Completed/Met Date Met: 12/05/16 Pt taught incentive spirometer use.  Pt demonstrated use and was able to get to 2000.  Pt instructed to do 5 times every hour while awake.  Pt verbalized understanding.

## 2016-12-05 NOTE — Op Note (Signed)
Kerri Yu PROCEDURE DATE: 12/05/2016  PREOPERATIVE DIAGNOSIS: Intrauterine pregnancy at  [redacted]w[redacted]d weeks gestation, desires repeat.  POSTOPERATIVE DIAGNOSIS: The same and breech presentation  PROCEDURE:  Repeat Low Transverse Cesarean Section  SURGEON:  Dr. [name]  INDICATIONS: Kerri Yu is a 36 y.o. G2P1001 at [redacted]w[redacted]d scheduled for cesarean section secondary to desire for repeat.  The risks of cesarean section discussed with the patient included but were not limited to: bleeding which may require transfusion or reoperation; infection which may require antibiotics; injury to bowel, bladder, ureters or other surrounding organs; injury to the fetus; need for additional procedures including hysterectomy in the event of a life-threatening hemorrhage; placental abnormalities wth subsequent pregnancies, incisional problems, thromboembolic phenomenon and other postoperative/anesthesia complications. The patient concurred with the proposed plan, giving informed written consent for the procedure.    FINDINGS:  Viable female infant in breech presentation, APGARs: 8,9  Weight pending  Clear amniotic fluid.  Intact placenta, three vessel cord.  Grossly normal uterus except 8 cm fibroid at left fundus, normal ovaries and fallopian tubes. .   ANESTHESIA:  Spinal ESTIMATED BLOOD LOSS: 600 ml SPECIMENS: Placenta sent to L&D COMPLICATIONS: None immediate  PROCEDURE IN DETAIL:  The patient received intravenous antibiotics and had sequential compression devices applied to her lower extremities while in the preoperative area.  She was then taken to the operating room where spinal anesthesia was administered and was found to be adequate. She was then placed in a dorsal supine position with a leftward tilt, and prepped and draped in a sterile manner.  A foley catheter was placed into her bladder and attached to constant gravity.  After an adequate timeout was performed, a Pfannenstiel skin incision was made  with scalpel and carried through to the underlying layer of fascia. The fascia was incised in the midline and this incision was extended bilaterally using the Mayo scissors. Kocher clamps were applied to the superior aspect of the fascial incision and the underlying rectus muscles were dissected off bluntly. A similar process was carried out on the inferior aspect of the facial incision. The rectus muscles were separated in the midline bluntly and the peritoneum was entered bluntly.  Bladder flap was created sharply and developed bluntly.  Bladder blade was placed.  A transverse hysterotomy was made with a scalpel and extended bilaterally bluntly. The bladder blade was then removed. The infant was successfully delivered using typical breech maneuvers, and cord was clamped and cut and infant was handed over to awaiting neonatology team. Uterine massage was then administered and the placenta delivered intact with three-vessel cord. The uterus was cleared of clot and debris. Large left fundal fibroid was noted.  The hysterotomy was closed with 0 chromic.  A second imbricating suture of 0-chromic was used to reinforce the incision and aid in hemostasis.  The peritoneum and rectus muscles were noted to be hemostatic and were reapproximated using 2-0 monocryl in a running fashion.  The fascia was closed with PDS in a running fashion with good restoration of anatomy.  The subcutaneus tissue was copiously irrigated.  The skin was closed with 4-0 vicryl in a subcuticular fashion  Pt tolerated the procedure will.  All counts were correct x2.  Pt went to the recovery room in stable condition.

## 2016-12-06 LAB — CBC
HCT: 31.1 % — ABNORMAL LOW (ref 36.0–46.0)
Hemoglobin: 10.9 g/dL — ABNORMAL LOW (ref 12.0–15.0)
MCH: 32.2 pg (ref 26.0–34.0)
MCHC: 35 g/dL (ref 30.0–36.0)
MCV: 91.7 fL (ref 78.0–100.0)
Platelets: 197 10*3/uL (ref 150–400)
RBC: 3.39 MIL/uL — AB (ref 3.87–5.11)
RDW: 13.4 % (ref 11.5–15.5)
WBC: 10.5 10*3/uL (ref 4.0–10.5)

## 2016-12-06 NOTE — Lactation Note (Signed)
This note was copied from a baby's chart. Lactation Consultation Note  Patient Name: Kerri Yu [id number] Date: 12/06/2016 Reason for consult: Follow-up assessment Baby at 27 hr of life. Upon entry mom was in the bathroom. Dad stated that baby has been latching well and cluster feeding. A visitor stated that baby is "bf really good". He will have mom call at the next bf.   Maternal Data    Feeding Feeding Type: Breast Fed Length of feed: 15 min  LATCH Score/Interventions Latch: Grasps breast easily, tongue down, lips flanged, rhythmical sucking.  Audible Swallowing: A few with stimulation  Type of Nipple: Everted at rest and after stimulation  Comfort (Breast/Nipple): Soft / non-tender     Hold (Positioning): No assistance needed to correctly position infant at breast.  LATCH Score: 9  Lactation Tools Discussed/Used     Consult Status Consult Status: Follow-up Date: 12/06/16 Follow-up type: In-patient    Denzil Hughes 12/06/2016, 5:18 PM

## 2016-12-06 NOTE — Progress Notes (Signed)
MOB was referred for history of depression/anxiety. * Referral screened out by Clinical Social Worker because none of the following criteria appear to apply: ~ History of anxiety/depression during this pregnancy, or of post-partum depression. ~ Diagnosis of anxiety and/or depression within last 3 years OR * MOB's symptoms currently being treated with medication and/or therapy. Please contact the Clinical Social Worker if needs arise, or if MOB requests.

## 2016-12-06 NOTE — Lactation Note (Signed)
This note was copied from a baby's chart. Lactation Consultation Note P2 mom with BF exp of 3 months.  Had BF difficulties with first child and mastitis but stated this infant seems to be feeding very well.  States infant cluster fed today.  Last fed around 4 pm.  Infant asleep on dad's chest.  Mom wanted LC to review how often to feed infant in beginning.  LC reviewed feeding cues, cluster feeds, 8-12 times in 24 hours.  Also reviewed with mom hand expression/mom states she is hand expressing and knows how, did not demonstrate.  LC encouraged hand exp. Prior to and after feeds.  Mom denies discomfort after first few seconds of latching with BF.  She states infant opens very wide with feeds.  Mom  Asked about when milk came in and [facility] reviewed importance of frequent feeding and feeding on demand.  Also reviewed how breast should feel after feeds once transitional and mature milk are in.  Reviewed with mom how hand exp. Or hand pump can be useful prior to feeds if breasts feel full.   Hand pump was provided for mom per pt. Request along with coconut oil.  Mom declines needing the oil now but says she would like to have it incase she gets sore after going home.  [place] encouraged her to call for lactation OP if she does have issues after going home so that we could help with the issue.  Mom denies having further questions and knows to call out for concerns, questions, or assistance with latch.  Mom prefers football hold and feels infant is latching well at this time.  BFSG info given to pt.    Patient Name: Boy Fletcher R[place]un [id number] Date: 12/06/2016 Reason for consult: Follow-up assessment   Maternal Data    Feeding Feeding Type: Breast Fed Length of feed: 0 min  LATCH Score/Interventions                      Lactation Tools Discussed/Used     Consult Status Consult Status: Follow-up Date: 12/06/16 Follow-up type: In-patient    Ferne Coe [facility] 12/06/2016, 7:13 PM

## 2016-12-06 NOTE — Progress Notes (Signed)
Post Partum Day 1 Subjective: no complaints  Objective: Blood pressure (!) 100/52, pulse 68, temperature 98.1 F (36.7 C), temperature source Oral, resp. rate 16, height 5\' 4"  (1.626 m), weight 157 lb (71.2 kg), last menstrual period 03/02/2016, SpO2 100 %, unknown if currently breastfeeding.  Physical Exam:  General: alert and cooperative Lochia: appropriate Uterine Fundus: firm Incision: healing well, no significant drainage DVT Evaluation: No evidence of DVT seen on physical exam.   Recent Labs  12/06/16 0524  HGB 10.9*  HCT 31.1*    Assessment/Plan:   Circumcision prior to discharge Continue current care  LOS: 1 day   Kerri Yu 12/06/2016, 8:28 AM

## 2016-12-07 MED ORDER — DOCUSATE SODIUM 100 MG PO CAPS: 100.0000 mg | ORAL_CAPSULE | Freq: Two times a day (BID) | ORAL | 2 refills | Status: DC

## 2016-12-07 MED ORDER — OXYCODONE-ACETAMINOPHEN 5-325 MG PO TABS: 1.0000 | ORAL_TABLET | Freq: Four times a day (QID) | ORAL | 0 refills | Status: DC | PRN

## 2016-12-07 MED ORDER — IBUPROFEN 600 MG PO TABS: 600.0000 mg | ORAL_TABLET | Freq: Four times a day (QID) | ORAL | 0 refills | Status: DC | PRN

## 2016-12-07 MED ORDER — FERROUS SULFATE 325 (65 FE) MG PO TBEC: 325.0000 mg | DELAYED_RELEASE_TABLET | Freq: Two times a day (BID) | ORAL | 2 refills | Status: DC

## 2016-12-07 NOTE — Lactation Note (Signed)
This note was copied from a baby's chart. Lactation Consultation Note  Patient Name: Kerri Yu [id number] Date: 12/07/2016 Reason for consult: Follow-up assessment  Baby 44 hours old. Mom reports that she is having some nipple soreness because baby is not always latching as deeply as needed. Offered to assist mom with latching more deeply, but mom declined stating that she will keep working with baby. Mom states that her milk supply is increasing--breasts becoming more heavy and colostrum flowing more freely. Discussed progression of milk coming to volume and enc mom to hand express if breasts uncomfortably full. Discussed S/S of mastitis and enc examining breasts--in mirror for underneath--if beginning to sense any issues with breasts.   Mom aware of OP/BFSG and [place] phone line assistance after D/C.   Maternal Data    Feeding Feeding Type: Breast Fed Length of feed: 5 min  LATCH Score/Interventions Latch: Grasps breast easily, tongue down, lips flanged, rhythmical sucking. Intervention(s): Adjust position  Audible Swallowing: Spontaneous and intermittent  Type of Nipple: Everted at rest and after stimulation  Comfort (Breast/Nipple): Filling, red/small blisters or bruises, mild/mod discomfort  Problem noted: Mild/Moderate discomfort Interventions (Mild/moderate discomfort):  (coconut oil)  Hold (Positioning): Assistance needed to correctly position infant at breast and maintain latch.  LATCH Score: 8  Lactation Tools Discussed/Used     Consult Status Consult Status: PRN    Andres Labrum 12/07/2016, 10:08 AM

## 2016-12-07 NOTE — Discharge Summary (Signed)
Obstetric Discharge Summary Reason for Admission: cesarean section Prenatal Procedures: none Intrapartum Procedures: cesarean: low cervical, transverse Postpartum Procedures: none Complications-Operative and Postpartum: none Hemoglobin  Date Value Ref Range Status  12/06/2016 10.9 (L) 12.0 - 15.0 g/dL Final   HCT  Date Value Ref Range Status  12/06/2016 31.1 (L) 36.0 - 46.0 % Final    Physical Exam:  General: alert, cooperative and appears stated age 36: appropriate Uterine Fundus: firm Incision: healing well, no significant drainage, no dehiscence, no significant erythema DVT Evaluation: No evidence of DVT seen on physical exam. Negative Homan's sign. No cords or calf tenderness. No significant calf/ankle edema.  Discharge Diagnoses: Term Pregnancy-delivered  Discharge Information: Date: 12/07/2016 Activity: pelvic rest Diet: routine Medications: Ibuprofen, Colace, Iron and Percocet Condition: stable Instructions: refer to practice specific booklet Discharge to: home   Newborn Data: Live born female  Birth Weight: 9 lb 4.5 oz (4210 g) APGAR: 8, 9  Home with mother.  Tyson Dense 12/07/2016, 10:45 AM

## 2016-12-07 NOTE — Plan of Care (Signed)
Problem: Education: Goal: Knowledge of condition will improve Discharge education, incision care and follow up reviewed with patient and significant other. Patient verbalizes understanding.

## 2016-12-12 ENCOUNTER — Encounter (HOSPITAL_COMMUNITY): Payer: Self-pay | Admitting: *Deleted

## 2019-05-24 DIAGNOSIS — K429 Umbilical hernia without obstruction or gangrene: Secondary | ICD-10-CM

## 2019-05-24 HISTORY — DX: Umbilical hernia without obstruction or gangrene: K42.9

## 2020-02-21 HISTORY — PX: UMBILICAL HERNIA REPAIR: SHX196

## 2021-11-03 ENCOUNTER — Encounter (HOSPITAL_COMMUNITY): Payer: Self-pay

## 2021-11-03 ENCOUNTER — Ambulatory Visit (HOSPITAL_COMMUNITY)
Admission: RE | Admit: 2021-11-03 | Discharge: 2021-11-03 | Disposition: A | Discharge status: Home / Self Care | Payer: BC Managed Care – PPO | Source: Ambulatory Visit | Attending: Family Medicine | Admitting: Family Medicine

## 2021-11-03 ENCOUNTER — Ambulatory Visit (INDEPENDENT_AMBULATORY_CARE_PROVIDER_SITE_OTHER): Payer: BC Managed Care – PPO

## 2021-11-03 VITALS — BP 141/91 | HR 75 | Temp 98.2°F | Resp 16 | Ht 64.0 in | Wt 125.0 lb

## 2021-11-03 DIAGNOSIS — M25552 Pain in left hip: Secondary | ICD-10-CM | POA: Diagnosis not present

## 2021-11-03 MED ORDER — DEXAMETHASONE SODIUM PHOSPHATE 10 MG/ML IJ SOLN
10.0000 mg | Freq: Once | INTRAMUSCULAR | Status: AC
  Administered 2021-11-03: 10 mg via INTRAMUSCULAR

## 2021-11-03 MED ORDER — HYDROCODONE-ACETAMINOPHEN 5-325 MG PO TABS: 1.0000 | ORAL_TABLET | Freq: Four times a day (QID) | ORAL | 0 refills | Status: DC | PRN

## 2021-11-03 MED ORDER — DEXAMETHASONE SODIUM PHOSPHATE 10 MG/ML IJ SOLN
INTRAMUSCULAR | Status: AC
  Filled 2021-11-03: qty 1

## 2021-11-03 NOTE — ED Triage Notes (Signed)
Patient having stabbing/burning pai in the left hip. Range of motion limited and pain worse with sitting and bending.   Patient threw her son into the pool and felt the pain right away. Was seen in urgent care and given Prednisone. Started meds yesterday. Pain worse and different this morning.

## 2021-11-03 NOTE — Discharge Instructions (Addendum)
Be aware, you have been prescribed pain medications that may cause drowsiness. While taking this medication, do not take any other medications containing acetaminophen (Tylenol). Do not combine with alcohol or other recreational drugs. Please do not drive, operate heavy machinery, or take part in activities that require making important decisions while on this medication as your judgement may be clouded.  

## 2021-11-08 NOTE — ED Provider Notes (Signed)
[facility]   [id number] 11/03/21 Arrival Time: 4854  ASSESSMENT & PLAN:  1. Left hip pain     I have personally viewed the imaging studies ordered this visit. No bony abnormalities or fx of L hip appreciated.  NSAID with foot. Activities as tolerated. As/if needed: Discharge Medication List as of 11/03/2021  4:13 PM     START taking these medications   Details  HYDROcodone-acetaminophen (NORCO/VICODIN) 5-325 MG tablet Take 1 tablet by mouth every 6 (six) hours as needed for moderate pain or severe pain., Starting Wed 11/03/2021, Normal        Recommend:  Follow-up Information     Schedule an appointment as soon as possible for a visit  with Gaynelle Arabian, MD.   Specialty: Orthopedic Surgery Contact information: [address] [place] [address] [place] [address] [phone]                 [place] Controlled Substances Registry consulted for this patient. I feel the risk/benefit ratio today is favorable for proceeding with this prescription for a controlled substance. Medication sedation precautions given.  Reviewed expectations re: course of current medical issues. Questions answered. Outlined signs and symptoms indicating need for more acute intervention. Patient verbalized understanding. After Visit Summary given.  SUBJECTIVE: History from: patient. Kerri Yu is a 41 y.o. female who reports stabbing/burning pai in the left hip. Range of motion limited and pain worse with sitting and bending. No trauma. Pain also in lower back/buttock when bending forward. Has been on prednisone with little relief. Normal bowel/bladder habits. Pain worse this am prompting visit. No extremity sensation changes or weakness.   Past Surgical History:  Procedure Laterality Date   CESAREAN SECTION N/A 03/12/2014   Procedure: CESAREAN SECTION;  Surgeon: [name], DO;  Location: [facility];  Service: Obstetrics;  Laterality: N/A;   CESAREAN SECTION N/A 12/05/2016    Procedure: CESAREAN SECTION;  Surgeon: [name], DO;  Location: [facility];  Service: Obstetrics;  Laterality: N/A;  Repeat edc 12/12/16 allergic to biaxin Heather K, RNFA   NO PAST SURGERIES     UMBILICAL HERNIA REPAIR N/A 02/2020      OBJECTIVE:  Vitals:   11/03/21 1440 11/03/21 1441  BP: (!) 141/91   Pulse: 75   Resp: 16   Temp: 98.2 F (36.8 C)   TempSrc: Oral   SpO2: 98%   Weight:  56.7 kg  Height:  '5\' 4"'$  (1.626 m)    General appearance: alert; no distress HEENT: [place]; AT Neck: supple with FROM Resp: unlabored respirations Extremities: LLE: warm with well perfused appearance; no signficant TTP over L hip or pelvis or buttock; without gross deformities; swelling: minimal; bruising: none; hip and knee ROM: normal Neurologic: gait normal; normal sensation and strength of LLE Psychological: alert and cooperative; normal mood and affect  Imaging: DG Hip Unilat With Pelvis 2-3 Views Left  Result Date: 11/03/2021 CLINICAL DATA:  Stabbing burning pain in the left hip. Limited range of motion. EXAM: DG HIP (WITH OR WITHOUT PELVIS) 2-3V LEFT COMPARISON:  None FINDINGS: No pelvic bone abnormality. Sacroiliac joints and symphysis pubis are normal. No hip abnormality. No joint space narrowing, osteophyte formation or avascular necrosis. IMPRESSION: Normal radiographs. Electronically Signed   By: Nelson Chimes M.D.   On: 11/03/2021 15:31      Allergies  Allergen Reactions   Biaxin [Clarithromycin] Hives    Past Medical History:  Diagnosis Date   Anxiety    hx panic attacks took zoloft  in the past   Blood transfusion without reported diagnosis    Complication of anesthesia    History of postpartum hemorrhage, currently pregnant    Medical history non-contributory    Social History   Socioeconomic History   Marital status: Married    Spouse name: Not on file   Number of children: Not on file   Years of education: Not on file   Highest education level: Not on  file  Occupational History   Not on file  Tobacco Use   Smoking status: Never   Smokeless tobacco: Never  Substance and Sexual Activity   Alcohol use: No   Drug use: No   Sexual activity: Yes  Other Topics Concern   Not on file  Social History Narrative   Not on file   Social Determinants of Health   Financial Resource Strain: Not on file  Food Insecurity: Not on file  Transportation Needs: Not on file  Physical Activity: Not on file  Stress: Not on file  Social Connections: Not on file   Family History  Problem Relation Age of Onset   Thyroid disease Mother    Kidney disease Mother        acute kidney failure   Hypertension Father    Diabetes Paternal Grandmother    Past Surgical History:  Procedure Laterality Date   CESAREAN SECTION N/A 03/12/2014   Procedure: CESAREAN SECTION;  Surgeon: [name], DO;  Location: [facility];  Service: Obstetrics;  Laterality: N/A;   CESAREAN SECTION N/A 12/05/2016   Procedure: CESAREAN SECTION;  Surgeon: [name], DO;  Location: [facility];  Service: Obstetrics;  Laterality: N/A;  Repeat edc 12/12/16 allergic to biaxin Jill Side, RNFA   NO PAST SURGERIES     UMBILICAL HERNIA REPAIR N/A 02/2020       Vanessa Kick, MD 11/08/21 1320

## 2022-02-23 ENCOUNTER — Telehealth: Payer: Self-pay

## 2022-02-23 ENCOUNTER — Ambulatory Visit
Admission: RE | Admit: 2022-02-23 | Discharge: 2022-02-23 | Disposition: A | Discharge status: Home / Self Care | Payer: BC Managed Care – PPO | Source: Ambulatory Visit | Attending: Urgent Care | Admitting: Urgent Care

## 2022-02-23 VITALS — BP 120/78 | HR 94 | Temp 99.9°F | Resp 16

## 2022-02-23 DIAGNOSIS — J189 Pneumonia, unspecified organism: Secondary | ICD-10-CM

## 2022-02-23 HISTORY — DX: Pneumonia, unspecified organism: J18.9

## 2022-02-23 MED ORDER — AMOXICILLIN-POT CLAVULANATE 875-125 MG PO TABS: 1.0000 | ORAL_TABLET | Freq: Two times a day (BID) | ORAL | 0 refills | Status: DC

## 2022-02-23 MED ORDER — PROMETHAZINE-DM 6.25-15 MG/5ML PO SYRP: 2.5000 mL | ORAL_SOLUTION | Freq: Three times a day (TID) | ORAL | 0 refills | Status: DC | PRN

## 2022-02-23 NOTE — ED Provider Notes (Signed)
[facility]  Note:  This document was prepared using Systems analyst and may include unintentional dictation errors.  MRN: [id number] DOB: 02/07/1981  Subjective:   Catarina Huntley is a 41 y.o. female presenting for 4 week history of persistent cough, chest congestion.  In the past week has been feeling more achy and symptoms are worse at night.  No history of asthma.  No history of respiratory disorders.  Patient is not a smoker.  No current facility-administered medications for this encounter.  Current Outpatient Medications:    cetirizine (ZYRTEC) 10 MG tablet, Take 10 mg by mouth daily., Disp: , Rfl:    docusate sodium (COLACE) 100 MG capsule, Take 1 capsule (100 mg total) by mouth 2 (two) times daily., Disp: 30 capsule, Rfl: 2   ferrous sulfate 325 (65 FE) MG EC tablet, Take 1 tablet (325 mg total) by mouth 2 (two) times daily., Disp: 60 tablet, Rfl: 2   HYDROcodone-acetaminophen (NORCO/VICODIN) 5-325 MG tablet, Take 1 tablet by mouth every 6 (six) hours as needed for moderate pain or severe pain., Disp: 10 tablet, Rfl: 0   ibuprofen (ADVIL,MOTRIN) 600 MG tablet, Take 1 tablet (600 mg total) by mouth every 6 (six) hours as needed., Disp: 30 tablet, Rfl: 0   predniSONE (STERAPRED UNI-PAK 21 TAB) 10 MG (21) TBPK tablet, Take by mouth., Disp: , Rfl:    Prenatal Vit-Fe Fumarate-FA (PRENATAL MULTIVITAMIN) TABS tablet, Take 1 tablet by mouth every evening. , Disp: , Rfl:    Allergies  Allergen Reactions   Biaxin [Clarithromycin] Hives    Past Medical History:  Diagnosis Date   Anxiety    hx panic attacks took zoloft in the past   Blood transfusion without reported diagnosis    Complication of anesthesia    History of postpartum hemorrhage, currently pregnant    Medical history non-contributory      Past Surgical History:  Procedure Laterality Date   CESAREAN SECTION N/A 03/12/2014   Procedure: CESAREAN SECTION;  Surgeon: [name],  DO;  Location: [facility];  Service: Obstetrics;  Laterality: N/A;   CESAREAN SECTION N/A 12/05/2016   Procedure: CESAREAN SECTION;  Surgeon: [name], DO;  Location: [facility];  Service: Obstetrics;  Laterality: N/A;  Repeat edc 12/12/16 allergic to biaxin Jill Side, RNFA   NO PAST SURGERIES     UMBILICAL HERNIA REPAIR N/A 02/2020    Family History  Problem Relation Age of Onset   Thyroid disease Mother    Kidney disease Mother        acute kidney failure   Hypertension Father    Diabetes Paternal Grandmother     Social History   Tobacco Use   Smoking status: Never   Smokeless tobacco: Never  Substance Use Topics   Alcohol use: No   Drug use: No    ROS   Objective:   Vitals: BP 120/78 (BP Location: Right Arm)   Pulse 94   Temp 99.9 F (37.7 C) (Oral)   Resp 16   LMP 02/16/2022 (Approximate)   SpO2 99%   Physical Exam Constitutional:      General: She is not in acute distress.    Appearance: Normal appearance. She is well-developed. She is not ill-appearing, toxic-appearing or diaphoretic.  HENT:     Head: Normocephalic and atraumatic.     Nose: Nose normal.     Mouth/Throat:     Mouth: Mucous membranes are moist.  Eyes:     General:  No scleral icterus.       Right eye: No discharge.        Left eye: No discharge.     Extraocular Movements: Extraocular movements intact.  Cardiovascular:     Rate and Rhythm: Normal rate and regular rhythm.     Heart sounds: Normal heart sounds. No murmur heard.    No friction rub. No gallop.  Pulmonary:     Effort: Pulmonary effort is normal. No respiratory distress.     Breath sounds: No stridor. No wheezing, rhonchi or rales.     Comments: Slight and lung sounds in bibasilar fields. Chest:     Chest wall: No tenderness.  Skin:    General: Skin is warm and dry.  Neurological:     General: No focal deficit present.     Mental Status: She is alert and oriented to person, place, and time.  Psychiatric:         Mood and Affect: Mood normal.        Behavior: Behavior normal.     Assessment and Plan :   PDMP not reviewed this encounter.  1. Community acquired pneumonia, unspecified laterality     Given timeline of illness will defer COVID and flu testing.  Start Augmentin to address community acquired pneumonia, walking pneumonia.  Patient declined chest x-ray and I am in agreement as I am going to treat empirically.  If she has no improvement coming into next week, recommended recheck and at that stage we will have to pursue a chest x-ray.  Counseled patient on potential for adverse effects with medications prescribed/recommended today, ER and return-to-clinic precautions discussed, patient verbalized understanding.    Jaynee Eagles, PA-C 02/23/22 1747

## 2022-02-23 NOTE — ED Triage Notes (Signed)
Pt states cough for the past 3 weeks

## 2022-09-06 ENCOUNTER — Encounter (HOSPITAL_BASED_OUTPATIENT_CLINIC_OR_DEPARTMENT_OTHER): Payer: Self-pay | Admitting: Obstetrics & Gynecology

## 2022-09-07 ENCOUNTER — Other Ambulatory Visit: Payer: Self-pay

## 2022-09-07 ENCOUNTER — Encounter (HOSPITAL_BASED_OUTPATIENT_CLINIC_OR_DEPARTMENT_OTHER): Payer: Self-pay | Admitting: Obstetrics & Gynecology

## 2022-09-07 NOTE — Progress Notes (Signed)
Spoke w/ via phone for pre-op interview---Kerri [name] needs dos---- urine pregnancy              [name] results------09/14/22 [name] appt for cbc, cmp,type & screen COVID test -----patient states asymptomatic no test needed Arrive at -------0530 on Monday, 09/19/22 NPO after MN NO Solid Food.  Clear liquids from MN until---0430 Med rec completed Medications to take morning of surgery -----Zyrtec Diabetic medication -----none Patient instructed no nail polish to be worn day of surgery Patient instructed to bring photo id and insurance card day of surgery Patient aware to have Driver (ride ) / caregiver    for 24 hours after surgery - husband, Kerri [name] Patient Special Instructions -----Extended / overnight stay instructions given. Pre-Op special Istructions -----none Patient verbalized understanding of instructions that were given at this phone interview. Patient denies shortness of breath, chest pain, fever, cough at this phone interview.

## 2022-09-07 NOTE — Progress Notes (Signed)
Your procedure is scheduled on Monday, 09/19/2022.  Report to [facility] AT  5:30 AM.   Call this number if you have problems the morning of surgery  [phone].   OUR ADDRESS IS [address].  [facility] ARE LOCATED IN [facility].  PLEASE BRING YOUR INSURANCE CARD AND PHOTO ID DAY OF SURGERY.  ONLY 2 PEOPLE ARE ALLOWED IN  WAITING  ROOM                                      REMEMBER:  DO NOT EAT FOOD, CANDY GUM OR MINTS  AFTER MIDNIGHT THE NIGHT BEFORE YOUR SURGERY . YOU MAY HAVE CLEAR LIQUIDS FROM MIDNIGHT THE NIGHT BEFORE YOUR SURGERY UNTIL  4:30 AM. NO CLEAR LIQUIDS AFTER   4:30 AM DAY OF SURGERY.  YOU MAY  BRUSH YOUR TEETH MORNING OF SURGERY AND RINSE YOUR MOUTH OUT, NO CHEWING GUM CANDY OR MINTS.     CLEAR LIQUID DIET    Allowed      Water                                                                   Coffee and tea, regular and decaf  (NO cream or milk products of any type, may sweeten)                         Carbonated beverages, regular and diet                                    Sports drinks like Gatorade _____________________________________________________________________     TAKE ONLY THESE MEDICATIONS MORNING OF SURGERY: Zyrtec    UP TO 4 VISITORS  MAY VISIT IN THE EXTENDED RECOVERY ROOM UNTIL 800 PM ONLY.  ONE  VISITOR AGE 6 AND OVER MAY SPEND THE NIGHT AND MUST BE IN EXTENDED RECOVERY ROOM NO LATER THAN 800 PM . YOUR DISCHARGE TIME AFTER YOU SPEND THE NIGHT IS 900 AM THE MORNING AFTER YOUR SURGERY.  YOU MAY PACK A SMALL OVERNIGHT BAG WITH TOILETRIES FOR YOUR OVERNIGHT STAY IF YOU WISH.  YOUR PRESCRIPTION MEDICATIONS WILL BE PROVIDED DURING [facility] STAY.                                      DO NOT WEAR JEWERLY/  METAL/  PIERCINGS (INCLUDING NO PLASTIC PIERCINGS) DO NOT WEAR LOTIONS, POWDERS, PERFUMES OR NAIL POLISH ON YOUR FINGERNAILS. TOENAIL POLISH IS OK TO WEAR. DO NOT SHAVE FOR 48 HOURS PRIOR TO DAY  OF SURGERY.  CONTACTS, GLASSES, OR DENTURES MAY NOT BE WORN TO SURGERY.  REMEMBER: NO SMOKING, VAPING ,  DRUGS OR ALCOHOL FOR 24 HOURS BEFORE YOUR SURGERY.                                    [facility] IS NOT RESPONSIBLE  FOR ANY  BELONGINGS.                                                                    Marland Kitchen           [facility] - Preparing for Surgery Before surgery, you can play an important role.  Because skin is not sterile, your skin needs to be as free of germs as possible.  You can reduce the number of germs on your skin by washing with CHG (chlorahexidine gluconate) soap before surgery.  CHG is an antiseptic cleaner which kills germs and bonds with the skin to continue killing germs even after washing. Please DO NOT use if you have an allergy to CHG or antibacterial soaps.  If your skin becomes reddened/irritated stop using the CHG and inform your nurse when you arrive at Short Stay. Do not shave (including legs and underarms) for at least 48 hours prior to the first CHG shower.  You may shave your face/neck. Please follow these instructions carefully:  1.  Shower with CHG Soap the night before surgery and the  morning of Surgery.  2.  If you choose to wash your hair, wash your hair first as usual with your  normal  shampoo.  3.  After you shampoo, rinse your hair and body thoroughly to remove the  shampoo.                                        4.  Use CHG as you would any other liquid soap.  You can apply chg directly  to the skin and wash , chg soap provided, night before and morning of your surgery.  5.  Apply the CHG Soap to your body ONLY FROM THE NECK DOWN.   Do not use on face/ open                           Wound or open sores. Avoid contact with eyes, ears mouth and genitals (private parts).                       Wash face,  Genitals (private parts) with your normal soap.             6.  Wash thoroughly, paying special attention to the area where your surgery  will be  performed.  7.  Thoroughly rinse your body with warm water from the neck down.  8.  DO NOT shower/wash with your normal soap after using and rinsing off  the CHG Soap.             9.  Pat yourself dry with a clean towel.            10.  Wear clean pajamas.            11.  Place clean sheets on your bed the night of your first shower and do not  sleep with pets. Day of Surgery : Do not apply any lotions/ powders the morning of surgery.  Please wear clean clothes to the hospital/surgery center.  IF YOU HAVE ANY SKIN IRRITATION  OR PROBLEMS WITH THE SURGICAL SOAP, PLEASE GET A BAR OF GOLD DIAL SOAP AND SHOWER THE NIGHT BEFORE YOUR SURGERY AND THE MORNING OF YOUR SURGERY. PLEASE LET THE NURSE KNOW MORNING OF YOUR SURGERY IF YOU HAD ANY PROBLEMS WITH THE SURGICAL SOAP.   ________________________________________________________________________                                                        QUESTIONS Mechele Claude PRE OP NURSE PHONE [phone].

## 2022-09-14 ENCOUNTER — Encounter (HOSPITAL_COMMUNITY)
Admission: RE | Admit: 2022-09-14 | Discharge: 2022-09-14 | Disposition: A | Discharge status: Home / Self Care | Payer: BC Managed Care – PPO | Source: Ambulatory Visit | Attending: Obstetrics & Gynecology | Admitting: Obstetrics & Gynecology

## 2022-09-14 DIAGNOSIS — Z01812 Encounter for preprocedural laboratory examination: Secondary | ICD-10-CM | POA: Diagnosis not present

## 2022-09-14 DIAGNOSIS — D219 Benign neoplasm of connective and other soft tissue, unspecified: Secondary | ICD-10-CM | POA: Insufficient documentation

## 2022-09-14 LAB — COMPREHENSIVE METABOLIC PANEL
ALT: 15 U/L (ref 0–44)
AST: 14 U/L — ABNORMAL LOW (ref 15–41)
Albumin: 4.3 g/dL (ref 3.5–5.0)
Alkaline Phosphatase: 59 U/L (ref 38–126)
Anion gap: 9 (ref 5–15)
BUN: 14 mg/dL (ref 6–20)
CO2: 23 mmol/L (ref 22–32)
Calcium: 9.3 mg/dL (ref 8.9–10.3)
Chloride: 106 mmol/L (ref 98–111)
Creatinine, Ser: 0.77 mg/dL (ref 0.44–1.00)
GFR, Estimated: >60 mL/min (ref >60)
Glucose, Bld: 103 mg/dL — ABNORMAL HIGH (ref 70–99)
Potassium: 4.1 mmol/L (ref 3.5–5.1)
Sodium: 138 mmol/L (ref 135–145)
Total Bilirubin: 0.6 mg/dL (ref 0.3–1.2)
Total Protein: 6.7 g/dL (ref 6.5–8.1)

## 2022-09-14 LAB — CBC
HCT: 43.9 % (ref 36.0–46.0)
Hemoglobin: 14.2 g/dL (ref 12.0–15.0)
MCH: 29 pg (ref 26.0–34.0)
MCHC: 32.3 g/dL (ref 30.0–36.0)
MCV: 89.6 fL (ref 80.0–100.0)
Platelets: 275 10*3/uL (ref 150–400)
RBC: 4.9 MIL/uL (ref 3.87–5.11)
RDW: 12.6 % (ref 11.5–15.5)
WBC: 4.2 10*3/uL (ref 4.0–10.5)
nRBC: 0 % (ref 0.0–0.2)

## 2022-09-14 LAB — TYPE AND SCREEN: Antibody Screen: NEGATIVE

## 2022-09-16 NOTE — H&P (Addendum)
Kerri Yu is an 42 y.o. female G2P2 with symptomatic uterine leiomyomata; desires definitve management.  Last u/s 11/27/20 with uterine volume 533 cc.  Posterior SS fibroids measures 9.2 x 5.4 cm, 6.1 x 4.2 cm, 4.7 x 2.9 cm and 2.7 cm.  Menses are not problematic for her but she does have bulk symptoms and reports pelvic pressure, inability to exercise on her abdomen, and visible anterior abdominal contour bulging.  She has completed child-bearing.  She declines conservative management.   Pertinent Gynecological History: Menses: flow is moderate Bleeding: regular Contraception: condoms DES exposure: unknown Blood transfusions:  2015 with C/S Sexually transmitted diseases: no past history Previous GYN Procedures:  C/S x 2   Last mammogram:  n/a  Date: n/a Last pap: normal Date: 12/06/21 OB History: G2, P2   Menstrual History: Menarche age: n/a Patient's last menstrual period was 09/01/2022 (exact date).    Past Medical History:  Diagnosis Date   Anxiety    hx panic attacks took zoloft in the past   Blood transfusion without reported diagnosis 2015   History of anemia    History of postpartum hemorrhage, currently pregnant 2015   Pneumonia 02/23/2022   community acquired pneumonia   Umbilical hernia 2021   Uterine leiomyoma     Past Surgical History:  Procedure Laterality Date   CESAREAN SECTION N/A 03/12/2014   Procedure: CESAREAN SECTION;  Surgeon: Mitchel Honour, DO;  Location: [facility];  Service: Obstetrics;  Laterality: N/A;   CESAREAN SECTION N/A 12/05/2016   Procedure: CESAREAN SECTION;  Surgeon: Mitchel Honour, DO;  Location: [facility];  Service: Obstetrics;  Laterality: N/A;  Repeat edc 12/12/16 allergic to biaxin Odelia Gage, RNFA   UMBILICAL HERNIA REPAIR N/A 02/2020    Family History  Problem Relation Age of Onset   Thyroid disease Mother    Kidney disease Mother        acute kidney failure   Hypertension Father    Diabetes Paternal Grandmother      Social History:  reports that she has never smoked. She has never used smokeless tobacco. She reports current alcohol use. She reports that she does not use drugs.  Allergies:  Allergies  Allergen Reactions   Biaxin [Clarithromycin] Hives    No medications prior to admission.    Review of Systems  Height 5\' 4"  (1.626 m), weight 56.7 kg, last menstrual period 09/01/2022, not currently breastfeeding. Physical Exam Constitutional:      Appearance: Normal appearance.  HENT:     Head: Normocephalic and atraumatic.  Pulmonary:     Effort: Pulmonary effort is normal.  Abdominal:     Palpations: Abdomen is soft.  Musculoskeletal:        General: Normal range of motion.     Cervical back: Normal range of motion.  Skin:    General: Skin is warm and dry.  Neurological:     Mental Status: She is alert and oriented to person, place, and time.  Psychiatric:        Mood and Affect: Mood normal.        Behavior: Behavior normal.     No results found for this or any previous visit (from the past 24 hour(s)).  No results found.  Assessment/Plan: 42yo with symptomatic uterine leiomyomata TAH, BS Patient is counseled re: risk of bleeding, infection, scarring and damage to surrounding structures. She is informed of need for TAH, BS given size of fibroids. Patient has h/o C/S and will use same incision. Patient is  counseled re: steps of procedure as well as postop expectations and limitations. All questions were answered and patient wishes to proceed.   Mitchel Honour 09/16/2022, 8:15 AM

## 2022-09-19 ENCOUNTER — Ambulatory Visit (HOSPITAL_BASED_OUTPATIENT_CLINIC_OR_DEPARTMENT_OTHER): Payer: BC Managed Care – PPO | Admitting: Anesthesiology

## 2022-09-19 ENCOUNTER — Other Ambulatory Visit: Payer: Self-pay

## 2022-09-19 ENCOUNTER — Observation Stay (HOSPITAL_BASED_OUTPATIENT_CLINIC_OR_DEPARTMENT_OTHER)
Admission: RE | Admit: 2022-09-19 | Discharge: 2022-09-20 | Disposition: A | Discharge status: Home / Self Care | Payer: BC Managed Care – PPO | Attending: Obstetrics & Gynecology | Admitting: Obstetrics & Gynecology

## 2022-09-19 ENCOUNTER — Encounter (HOSPITAL_BASED_OUTPATIENT_CLINIC_OR_DEPARTMENT_OTHER)
Admission: RE | Disposition: A | Discharge status: Home / Self Care | Payer: Self-pay | Source: Home / Self Care | Attending: Obstetrics & Gynecology

## 2022-09-19 ENCOUNTER — Encounter (HOSPITAL_BASED_OUTPATIENT_CLINIC_OR_DEPARTMENT_OTHER): Payer: Self-pay | Admitting: Obstetrics & Gynecology

## 2022-09-19 DIAGNOSIS — Z01818 Encounter for other preprocedural examination: Secondary | ICD-10-CM

## 2022-09-19 DIAGNOSIS — D259 Leiomyoma of uterus, unspecified: Secondary | ICD-10-CM | POA: Diagnosis present

## 2022-09-19 DIAGNOSIS — Z90710 Acquired absence of both cervix and uterus: Secondary | ICD-10-CM | POA: Diagnosis present

## 2022-09-19 DIAGNOSIS — D219 Benign neoplasm of connective and other soft tissue, unspecified: Secondary | ICD-10-CM

## 2022-09-19 HISTORY — DX: Personal history of diseases of the blood and blood-forming organs and certain disorders involving the immune mechanism: Z86.2

## 2022-09-19 HISTORY — DX: Leiomyoma of uterus, unspecified: D25.9

## 2022-09-19 HISTORY — PX: HYSTERECTOMY ABDOMINAL WITH SALPINGECTOMY: SHX6725

## 2022-09-19 LAB — TYPE AND SCREEN: ABO/RH(D): AB POS

## 2022-09-19 LAB — POCT PREGNANCY, URINE: Preg Test, Ur: NEGATIVE

## 2022-09-19 SURGERY — HYSTERECTOMY, TOTAL, ABDOMINAL, WITH SALPINGECTOMY
Anesthesia: General | Site: Abdomen | Laterality: Bilateral

## 2022-09-19 MED ORDER — ROCURONIUM BROMIDE 10 MG/ML (PF) SYRINGE
PREFILLED_SYRINGE | INTRAVENOUS | Status: DC | PRN
  Administered 2022-09-19 (×2): 10 mg via INTRAVENOUS
  Administered 2022-09-19: 50 mg via INTRAVENOUS
  Administered 2022-09-19: 10 mg via INTRAVENOUS

## 2022-09-19 MED ORDER — SUGAMMADEX SODIUM 200 MG/2ML IV SOLN
INTRAVENOUS | Status: DC | PRN
  Administered 2022-09-19: 200 mg via INTRAVENOUS

## 2022-09-19 MED ORDER — PROPOFOL 10 MG/ML IV BOLUS
INTRAVENOUS | Status: AC
  Filled 2022-09-19: qty 20

## 2022-09-19 MED ORDER — FENTANYL CITRATE (PF) 100 MCG/2ML IJ SOLN
INTRAMUSCULAR | Status: AC
  Filled 2022-09-19: qty 2

## 2022-09-19 MED ORDER — FENTANYL CITRATE (PF) 100 MCG/2ML IJ SOLN
25.0000 ug | INTRAMUSCULAR | Status: DC | PRN
  Administered 2022-09-19 (×2): 50 ug via INTRAVENOUS

## 2022-09-19 MED ORDER — PHENYLEPHRINE 80 MCG/ML (10ML) SYRINGE FOR IV PUSH (FOR BLOOD PRESSURE SUPPORT)
PREFILLED_SYRINGE | INTRAVENOUS | Status: AC
  Filled 2022-09-19: qty 10

## 2022-09-19 MED ORDER — ONDANSETRON HCL 4 MG/2ML IJ SOLN: 4.0000 mg | Freq: Four times a day (QID) | INTRAMUSCULAR | Status: DC | PRN

## 2022-09-19 MED ORDER — OXYCODONE HCL 5 MG PO TABS
ORAL_TABLET | ORAL | Status: AC
  Filled 2022-09-19: qty 2

## 2022-09-19 MED ORDER — DEXMEDETOMIDINE HCL IN NACL 80 MCG/20ML IV SOLN
INTRAVENOUS | Status: DC | PRN
  Administered 2022-09-19: 8 ug via INTRAVENOUS

## 2022-09-19 MED ORDER — LACTATED RINGERS IV SOLN: INTRAVENOUS | Status: DC

## 2022-09-19 MED ORDER — HYDROMORPHONE HCL 1 MG/ML IJ SOLN
0.2000 mg | INTRAMUSCULAR | Status: DC | PRN
  Administered 2022-09-19: 0.25 mg via INTRAVENOUS
  Administered 2022-09-19: 0.6 mg via INTRAVENOUS
  Administered 2022-09-19: 0.3 mg via INTRAVENOUS

## 2022-09-19 MED ORDER — EPHEDRINE SULFATE-NACL 50-0.9 MG/10ML-% IV SOSY
PREFILLED_SYRINGE | INTRAVENOUS | Status: DC | PRN
  Administered 2022-09-19: 10 mg via INTRAVENOUS
  Administered 2022-09-19: 7.5 mg via INTRAVENOUS

## 2022-09-19 MED ORDER — ACETAMINOPHEN 500 MG PO TABS
ORAL_TABLET | ORAL | Status: AC
  Filled 2022-09-19: qty 2

## 2022-09-19 MED ORDER — FENTANYL CITRATE (PF) 100 MCG/2ML IJ SOLN
INTRAMUSCULAR | Status: DC | PRN
  Administered 2022-09-19: 25 ug via INTRAVENOUS
  Administered 2022-09-19: 100 ug via INTRAVENOUS
  Administered 2022-09-19: 25 ug via INTRAVENOUS

## 2022-09-19 MED ORDER — DIPHENHYDRAMINE HCL 50 MG/ML IJ SOLN
INTRAMUSCULAR | Status: DC | PRN
  Administered 2022-09-19: 12.5 mg via INTRAVENOUS

## 2022-09-19 MED ORDER — CELECOXIB 200 MG PO CAPS
200.0000 mg | ORAL_CAPSULE | Freq: Once | ORAL | Status: AC
  Administered 2022-09-19: 200 mg via ORAL

## 2022-09-19 MED ORDER — DEXAMETHASONE SODIUM PHOSPHATE 10 MG/ML IJ SOLN
INTRAMUSCULAR | Status: DC | PRN
  Administered 2022-09-19: 10 mg via INTRAVENOUS

## 2022-09-19 MED ORDER — SIMETHICONE 80 MG PO CHEW: 80.0000 mg | CHEWABLE_TABLET | Freq: Four times a day (QID) | ORAL | Status: DC | PRN

## 2022-09-19 MED ORDER — GLYCOPYRROLATE PF 0.2 MG/ML IJ SOSY
PREFILLED_SYRINGE | INTRAMUSCULAR | Status: AC
  Filled 2022-09-19: qty 1

## 2022-09-19 MED ORDER — DOCUSATE SODIUM 100 MG PO CAPS
ORAL_CAPSULE | ORAL | Status: AC
  Filled 2022-09-19: qty 1

## 2022-09-19 MED ORDER — MIDAZOLAM HCL 5 MG/5ML IJ SOLN
INTRAMUSCULAR | Status: DC | PRN
  Administered 2022-09-19: 2 mg via INTRAVENOUS

## 2022-09-19 MED ORDER — PROPOFOL 10 MG/ML IV BOLUS
INTRAVENOUS | Status: DC | PRN
  Administered 2022-09-19: 150 mg via INTRAVENOUS

## 2022-09-19 MED ORDER — ACETAMINOPHEN 500 MG PO TABS
1000.0000 mg | ORAL_TABLET | Freq: Once | ORAL | Status: AC
  Administered 2022-09-19: 1000 mg via ORAL

## 2022-09-19 MED ORDER — GLYCOPYRROLATE PF 0.2 MG/ML IJ SOSY
PREFILLED_SYRINGE | INTRAMUSCULAR | Status: DC | PRN
  Administered 2022-09-19: .2 mg via INTRAVENOUS

## 2022-09-19 MED ORDER — CELECOXIB 200 MG PO CAPS
ORAL_CAPSULE | ORAL | Status: AC
  Filled 2022-09-19: qty 1

## 2022-09-19 MED ORDER — SCOPOLAMINE 1 MG/3DAYS TD PT72
MEDICATED_PATCH | TRANSDERMAL | Status: AC
  Filled 2022-09-19: qty 1

## 2022-09-19 MED ORDER — OXYCODONE HCL 5 MG/5ML PO SOLN: 5.0000 mg | Freq: Once | ORAL | Status: DC | PRN

## 2022-09-19 MED ORDER — HYDROMORPHONE HCL 1 MG/ML IJ SOLN
INTRAMUSCULAR | Status: AC
  Filled 2022-09-19: qty 1

## 2022-09-19 MED ORDER — PHENYLEPHRINE 80 MCG/ML (10ML) SYRINGE FOR IV PUSH (FOR BLOOD PRESSURE SUPPORT)
PREFILLED_SYRINGE | INTRAVENOUS | Status: DC | PRN
  Administered 2022-09-19 (×2): 160 ug via INTRAVENOUS
  Administered 2022-09-19: 80 ug via INTRAVENOUS

## 2022-09-19 MED ORDER — DIPHENHYDRAMINE HCL 50 MG/ML IJ SOLN
INTRAMUSCULAR | Status: AC
  Filled 2022-09-19: qty 1

## 2022-09-19 MED ORDER — LIDOCAINE 2% (20 MG/ML) 5 ML SYRINGE
INTRAMUSCULAR | Status: DC | PRN
  Administered 2022-09-19: 60 mg via INTRAVENOUS

## 2022-09-19 MED ORDER — DOCUSATE SODIUM 100 MG PO CAPS
100.0000 mg | ORAL_CAPSULE | Freq: Two times a day (BID) | ORAL | Status: DC
  Administered 2022-09-19 (×2): 100 mg via ORAL

## 2022-09-19 MED ORDER — CEFAZOLIN SODIUM-DEXTROSE 2-4 GM/100ML-% IV SOLN
INTRAVENOUS | Status: AC
  Filled 2022-09-19: qty 100

## 2022-09-19 MED ORDER — MIDAZOLAM HCL 2 MG/2ML IJ SOLN
INTRAMUSCULAR | Status: AC
  Filled 2022-09-19: qty 2

## 2022-09-19 MED ORDER — SCOPOLAMINE 1 MG/3DAYS TD PT72
1.0000 | MEDICATED_PATCH | TRANSDERMAL | Status: DC
  Administered 2022-09-19: 1.5 mg via TRANSDERMAL

## 2022-09-19 MED ORDER — DEXMEDETOMIDINE HCL IN NACL 80 MCG/20ML IV SOLN
INTRAVENOUS | Status: AC
  Filled 2022-09-19: qty 20

## 2022-09-19 MED ORDER — LACTATED RINGERS IV SOLN
INTRAVENOUS | Status: DC
  Administered 2022-09-19: 1 mL via INTRAVENOUS

## 2022-09-19 MED ORDER — 0.9 % SODIUM CHLORIDE (POUR BTL) OPTIME
TOPICAL | Status: DC | PRN
  Administered 2022-09-19 (×2): 1000 mL

## 2022-09-19 MED ORDER — KETOROLAC TROMETHAMINE 15 MG/ML IJ SOLN
INTRAMUSCULAR | Status: AC
  Filled 2022-09-19: qty 1

## 2022-09-19 MED ORDER — POVIDONE-IODINE 10 % EX SWAB
2.0000 | Freq: Once | CUTANEOUS | Status: AC
  Administered 2022-09-19: 2 via TOPICAL

## 2022-09-19 MED ORDER — ONDANSETRON HCL 4 MG PO TABS: 4.0000 mg | ORAL_TABLET | Freq: Four times a day (QID) | ORAL | Status: DC | PRN

## 2022-09-19 MED ORDER — MENTHOL 3 MG MT LOZG: 1.0000 | LOZENGE | OROMUCOSAL | Status: DC | PRN

## 2022-09-19 MED ORDER — OXYCODONE HCL 5 MG PO TABS
5.0000 mg | ORAL_TABLET | ORAL | Status: DC | PRN
  Administered 2022-09-19 – 2022-09-20 (×6): 10 mg via ORAL

## 2022-09-19 MED ORDER — OXYCODONE HCL 5 MG PO TABS: 5.0000 mg | ORAL_TABLET | Freq: Once | ORAL | Status: DC | PRN

## 2022-09-19 MED ORDER — LIDOCAINE HCL (PF) 2 % IJ SOLN
INTRAMUSCULAR | Status: AC
  Filled 2022-09-19: qty 5

## 2022-09-19 MED ORDER — CEFAZOLIN SODIUM-DEXTROSE 2-4 GM/100ML-% IV SOLN
2.0000 g | INTRAVENOUS | Status: AC
  Administered 2022-09-19: 2 g via INTRAVENOUS

## 2022-09-19 MED ORDER — ROCURONIUM BROMIDE 10 MG/ML (PF) SYRINGE
PREFILLED_SYRINGE | INTRAVENOUS | Status: AC
  Filled 2022-09-19: qty 10

## 2022-09-19 MED ORDER — KETOROLAC TROMETHAMINE 15 MG/ML IJ SOLN
15.0000 mg | Freq: Four times a day (QID) | INTRAMUSCULAR | Status: DC
  Administered 2022-09-19 – 2022-09-20 (×4): 15 mg via INTRAVENOUS

## 2022-09-19 MED ORDER — ACETAMINOPHEN 500 MG PO TABS
1000.0000 mg | ORAL_TABLET | Freq: Four times a day (QID) | ORAL | Status: DC
  Administered 2022-09-19 – 2022-09-20 (×3): 1000 mg via ORAL

## 2022-09-19 MED ORDER — ONDANSETRON HCL 4 MG/2ML IJ SOLN
INTRAMUSCULAR | Status: AC
  Filled 2022-09-19: qty 2

## 2022-09-19 MED ORDER — ONDANSETRON HCL 4 MG/2ML IJ SOLN
INTRAMUSCULAR | Status: DC | PRN
  Administered 2022-09-19: 4 mg via INTRAVENOUS

## 2022-09-19 SURGICAL SUPPLY — 42 items
APL SKNCLS STERI-STRIP NONHPOA (GAUZE/BANDAGES/DRESSINGS) ×1
BENZOIN TINCTURE PRP APPL 2/3 (GAUZE/BANDAGES/DRESSINGS) ×1 IMPLANT
BLADE CLIPPER SENSICLIP SURGIC (BLADE) IMPLANT
DRAPE CESAREAN BIRTH W POUCH (DRAPES) ×1 IMPLANT
DRAPE WARM FLUID 44X44 (DRAPES) ×1 IMPLANT
DRSG OPSITE POSTOP 4X10 (GAUZE/BANDAGES/DRESSINGS) ×1 IMPLANT
DRSG TELFA 3X8 NADH STRL (GAUZE/BANDAGES/DRESSINGS) IMPLANT
DURAPREP 26ML APPLICATOR (WOUND CARE) ×1 IMPLANT
ELECT REM PT RETURN 9FT ADLT (ELECTROSURGICAL) ×1
ELECTRODE REM PT RTRN 9FT ADLT (ELECTROSURGICAL) ×1 IMPLANT
GAUZE 4X4 16PLY ~~LOC~~+RFID DBL (SPONGE) ×1 IMPLANT
GLOVE BIO SURGEON STRL SZ 6 (GLOVE) ×2 IMPLANT
GLOVE BIO SURGEON STRL SZ7 (GLOVE) IMPLANT
GLOVE BIOGEL PI IND STRL 6 (GLOVE) ×1 IMPLANT
GLOVE BIOGEL PI IND STRL 7.0 (GLOVE) IMPLANT
GLOVE SS PI  5.5 STRL (GLOVE) ×1
GLOVE SS PI 5.5 STRL (GLOVE) ×1 IMPLANT
GOWN STRL REUS W/TWL LRG LVL3 (GOWN DISPOSABLE) ×1 IMPLANT
HEMOSTAT SURGICEL 4X8 (HEMOSTASIS) IMPLANT
HOLDER FOLEY CATH W/STRAP (MISCELLANEOUS) IMPLANT
KIT TURNOVER CYSTO (KITS) ×1 IMPLANT
NS IRRIG 500ML POUR BTL (IV SOLUTION) ×3 IMPLANT
PACK ABDOMINAL GYN (CUSTOM PROCEDURE TRAY) ×1 IMPLANT
PAD OB MATERNITY 4.3X12.25 (PERSONAL CARE ITEMS) ×1 IMPLANT
SLEEVE SCD COMPRESS KNEE MED (STOCKING) ×1 IMPLANT
SPIKE FLUID TRANSFER (MISCELLANEOUS) IMPLANT
SPONGE T-LAP 18X18 ~~LOC~~+RFID (SPONGE) ×1 IMPLANT
STRIP CLOSURE SKIN 1/2X4 (GAUZE/BANDAGES/DRESSINGS) ×1 IMPLANT
SUT MNCRL 0 MO-4 VIOLET 18 CR (SUTURE) ×2 IMPLANT
SUT MNCRL 0 VIOLET 6X18 (SUTURE) ×1 IMPLANT
SUT MON AB 2-0 CT1 36 (SUTURE) IMPLANT
SUT MON AB-0 CT1 36 (SUTURE) ×1 IMPLANT
SUT PDS AB 0 CT1 27 (SUTURE) IMPLANT
SUT PLAIN 2 0 XLH (SUTURE) ×1 IMPLANT
SUT VIC AB 0 CT1 27 (SUTURE)
SUT VIC AB 0 CT1 27XCR 8 STRN (SUTURE) IMPLANT
SUT VIC AB 0 CT1 36 (SUTURE) IMPLANT
SUT VIC AB 4-0 KS 27 (SUTURE) ×1 IMPLANT
TOWEL OR 17X24 6PK STRL BLUE (TOWEL DISPOSABLE) ×1 IMPLANT
TRAY FOLEY W/BAG SLVR 14FR LF (SET/KITS/TRAYS/PACK) ×1 IMPLANT
WATER STERILE IRR 1000ML POUR (IV SOLUTION) IMPLANT
WATER STERILE IRR 500ML POUR (IV SOLUTION) IMPLANT

## 2022-09-19 NOTE — Anesthesia Procedure Notes (Signed)
Procedure Name: Intubation Date/Time: 09/19/2022 7:34 AM  Performed by: Bishop Limbo, CRNAPre-anesthesia Checklist: Patient identified, Emergency Drugs available, Suction available and Patient being monitored Patient Re-evaluated:Patient Re-evaluated prior to induction Oxygen Delivery Method: Circle System Utilized Preoxygenation: Pre-oxygenation with 100% oxygen Induction Type: IV induction Ventilation: Mask ventilation without difficulty Laryngoscope Size: Mac and 3 Grade View: Grade I Tube type: Oral Tube size: 7.0 mm Number of attempts: 1 Airway Equipment and Method: Stylet Placement Confirmation: ETT inserted through vocal cords under direct vision, positive ETCO2 and breath sounds checked- equal and bilateral Secured at: 22 cm Tube secured with: Tape Dental Injury: Teeth and Oropharynx as per pre-operative assessment

## 2022-09-19 NOTE — Op Note (Signed)
Kerri Yu PROCEDURE DATE: 09/19/2022  PREOPERATIVE DIAGNOSIS:  Symptomatic fibroids POSTOPERATIVE DIAGNOSIS:  Symptomatic fibroids SURGEON:  Dr. Mitchel Honour ASSISTANT:   Dr. Zelphia Cairo OPERATION:  Total abdominal hysterectomy, bilateral salpingectomy ANESTHESIA:  General endotracheal.  INDICATIONS: The patient is a 42 y.o. [id number] with history of symptomatic uterine fibroids. The patient made a decision to undergo definite surgical treatment. On the preoperative visit, the risks, benefits, indications, and alternatives of the procedure were reviewed with the patient.  On the day of surgery, the risks of surgery were again discussed with the patient including but not limited to: bleeding which may require transfusion or reoperation; infection which may require antibiotics; injury to bowel, bladder, ureters or other surrounding organs; need for additional procedures; thromboembolic phenomenon, incisional problems and other postoperative/anesthesia complications. Written informed consent was obtained.    OPERATIVE FINDINGS: An 8 week size uterus with large pedunculated fibroid at fundus with normal tubes and ovaries bilaterally.  Omental adhesions to posterior aspect of fibroid.  Normal appearing appendix.  ESTIMATED BLOOD LOSS: 100 ml SPECIMENS:  Uterus and fallopian tubes sent to pathology COMPLICATIONS:  None immediate.   DESCRIPTION OF PROCEDURE: The patient received intravenous antibiotics and had sequential compression devices applied to her lower extremities while in the preoperative area.   She was taken to the operating room and placed under general anesthesia without difficulty and found to be adequate.The abdomen and perineum were prepped and draped in a sterile manner, and a Foley catheter was inserted into the bladder and attached to constant drainage. After an adequate timeout was performed, a Pfannensteil skin incision was made. This incision was taken down to the fascia  using electrocautery with care given to maintain good hemostasis. The fascia was incised in the midline and the fascial incision was then extended bilaterally using electrocautery without difficulty. The fascia was then dissected off the underlying rectus muscles using blunt and sharp dissection. The rectus muscles were split bluntly in the midline and the peritoneum entered sharply without complication. This peritoneal incision was then extended superiorly and inferiorly with care given to prevent bowel or bladder injury. Upon entry into the abdominal cavity, the upper abdomen was inspected and found to be normal. Attention was then turned to the pelvis. The fibroid and uterus at this point was noted to be mobilized. Extensive posterior omental adhesions to the fibroid were noted.  Using sharp and blunt dissection and Bovie cautery for hemostasis, the adhesions were taken down being mindful of bowel anatomy.  Oozing from the omentum was rendered hemostatic using running 2-0 monocryl stitch.  Once the fibroid was freed from adhesions, it was excised from the fundus to allow improved manipulation of tissues and visibility.  Next, the fallopian tube was freed from the mesosalpinx in serial bites with clamp, cut, 2-0 monocryl suture ligation bilaterally.  Fallopian tubes were excised to improve visualization.  The round ligaments on each side were clamped, suture ligated, and transected with electrocautery allowing entry into the broad ligament. The anterior and posterior leaves of the broad ligament were separated and the ureters were inspected to be safely away from the area of dissection bilaterally. A hole was created in the clear portion of the posterior broad ligament. Adnexae were clamped on the patient's right side. This pedicle was then clamped, cut, and doubly suture ligated with good hemostasis. This procedure was repeated in an identical fashion on the left site allowing for both ovaries to remain in place.    A bladder flap was then  created across the anterior leaf of the broad ligament and the bladder was then bluntly dissected off the lower uterine segment and cervix with good hemostasis. The uterine arteries were then skeletonized bilaterally and then clamped, cut, and ligated with care given to prevent ureteral injury. The uterosacral ligaments were then clamped, cut, and ligated bilaterally. Finally, the cardinal ligaments were clamped, cut, and ligated bilaterally.  Acutely curved clamps were placed across the vagina just under the cervix, and the specimen was amputated and sent to pathology. The vaginal cuff was closed with a series of figure of eight 0 monocryl sutures with care given to incorporate the anterior pubocervical fascia and the posterior rectovaginal fascia.  The vaginal cuff angles were noted to have good hemostasis.  The pelvis was irrigated and hemostasis was reconfirmed at all pedicles and along the pelvic sidewall.  The omentum was again inspected and continued to be hemostatic.  All laparotomy sponges and instruments were removed from the abdomen. The peritoneum was closed with 2-0 monocryl, and the fascia was closed with 0 PDS in a running fashion. The skin was closed with a 4-0 vicryl subcuticular stitch. Sponge, lap, needle, and instrument counts were correct times two. The patient was taken to the recovery area awake, extubated and in stable condition.

## 2022-09-19 NOTE — Anesthesia Preprocedure Evaluation (Signed)
Anesthesia Evaluation  Patient identified by MRN, date of birth, ID band Patient awake    Reviewed: Allergy & Precautions, NPO status , Patient's Chart, lab work & pertinent test results  Airway Mallampati: II  TM Distance: >3 FB Neck ROM: Full    Dental no notable dental hx.    Pulmonary neg pulmonary ROS   Pulmonary exam normal        Cardiovascular negative cardio ROS  Rhythm:Regular Rate:Normal     Neuro/Psych   Anxiety     negative neurological ROS     GI/Hepatic negative GI ROS, Neg liver ROS,,,  Endo/Other  negative endocrine ROS    Renal/GU negative Renal ROS  Female GU complaint     Musculoskeletal negative musculoskeletal ROS (+)    Abdominal Normal abdominal exam  (+)   Peds  Hematology negative hematology ROS (+) Lab Results      Component                Value               Date                      WBC                      4.2                 09/14/2022                HGB                      14.2                09/14/2022                HCT                      43.9                09/14/2022                MCV                      89.6                09/14/2022                PLT                      275                 09/14/2022              Anesthesia Other Findings   Reproductive/Obstetrics                             Anesthesia Physical Anesthesia Plan  ASA: 2  Anesthesia Plan: General   Post-op Pain Management: Celebrex PO (pre-op)* and Tylenol PO (pre-op)*   Induction: Intravenous  PONV Risk Score and Plan: 3 and Ondansetron, Dexamethasone, Midazolam and Treatment may vary due to age or medical condition  Airway Management Planned: Mask and Oral ETT  Additional Equipment: None  Intra-op Plan:   Post-operative Plan: Extubation in OR  Informed Consent: I have reviewed the patients History and Physical, chart, labs and discussed the procedure including  the risks, benefits and alternatives for the proposed anesthesia with the patient or authorized representative who has indicated his/her understanding and acceptance.     Dental advisory given  Plan Discussed with: CRNA  Anesthesia Plan Comments:        Anesthesia Quick Evaluation

## 2022-09-19 NOTE — Progress Notes (Signed)
Day of Surgery Procedure(s) (LRB): HYSTERECTOMY ABDOMINAL WITH SALPINGECTOMY (Bilateral)  Subjective: Patient reports incisional pain and tolerating PO.  No N/V.  No CP/SOB.  Objective: I have reviewed patient's vital signs, intake and output, and medications.  General: alert, cooperative, and appears stated age Extremities: extremities normal, atraumatic, no cyanosis or edema Abd: soft, ND.  Dressing with small stain on right of incision  Assessment: s/p Procedure(s): HYSTERECTOMY ABDOMINAL WITH SALPINGECTOMY (Bilateral): stable and progressing well  Plan: Advance diet Encourage ambulation Advance to PO medication AM labs pending D/C foley and IVF in AM   LOS: 0 days    Mitchel Honour, DO 09/19/2022, 1:49 PM

## 2022-09-19 NOTE — Transfer of Care (Signed)
Immediate Anesthesia Transfer of Care Note  Patient: Kerri Yu  Procedure(s) Performed: HYSTERECTOMY ABDOMINAL WITH SALPINGECTOMY (Bilateral: Abdomen)  Patient Location: PACU  Anesthesia Type:General  Level of Consciousness: awake, alert , oriented, and patient cooperative  Airway & Oxygen Therapy: Patient Spontanous Breathing  Post-op Assessment: Report given to RN and Post -op Vital signs reviewed and stable  Post vital signs: Reviewed and stable  Last Vitals:  Vitals Value Taken Time  BP 112/64 09/19/22 0945  Temp    Pulse 82 09/19/22 0947  Resp 14 09/19/22 0947  SpO2 97 % 09/19/22 0947  Vitals shown include unvalidated device data.  Last Pain:  Vitals:   09/19/22 0602  TempSrc: Oral  PainSc: 0-No pain      Patients Stated Pain Goal: 5 (09/19/22 0602)  Complications: No notable events documented.

## 2022-09-19 NOTE — Progress Notes (Signed)
No change to H&P.  Holland Nickson, DO 

## 2022-09-19 NOTE — Anesthesia Postprocedure Evaluation (Signed)
Anesthesia Post Note  Patient: Kerri Yu  Procedure(s) Performed: HYSTERECTOMY ABDOMINAL WITH SALPINGECTOMY (Bilateral: Abdomen)     Patient location during evaluation: PACU Anesthesia Type: General Level of consciousness: awake and alert Pain management: pain level controlled Vital Signs Assessment: post-procedure vital signs reviewed and stable Respiratory status: spontaneous breathing, nonlabored ventilation, respiratory function stable and patient connected to nasal cannula oxygen Cardiovascular status: blood pressure returned to baseline and stable Postop Assessment: no apparent nausea or vomiting Anesthetic complications: no   No notable events documented.  Last Vitals:  Vitals:   09/19/22 1056 09/19/22 1137  BP: 105/61 (!) 104/55  Pulse: 68 64  Resp: 16 16  Temp:    SpO2: 96% 97%    Last Pain:  Vitals:   09/19/22 1215  TempSrc:   PainSc: 5                  Rin Gorton P Lanore Renderos

## 2022-09-20 ENCOUNTER — Encounter (HOSPITAL_BASED_OUTPATIENT_CLINIC_OR_DEPARTMENT_OTHER): Payer: Self-pay | Admitting: Obstetrics & Gynecology

## 2022-09-20 DIAGNOSIS — D259 Leiomyoma of uterus, unspecified: Secondary | ICD-10-CM | POA: Diagnosis not present

## 2022-09-20 LAB — CBC
HCT: 31 % — ABNORMAL LOW (ref 36.0–46.0)
Hemoglobin: 9.9 g/dL — ABNORMAL LOW (ref 12.0–15.0)
MCH: 29.6 pg (ref 26.0–34.0)
MCHC: 31.9 g/dL (ref 30.0–36.0)
MCV: 92.8 fL (ref 80.0–100.0)
Platelets: 213 10*3/uL (ref 150–400)
RBC: 3.34 MIL/uL — ABNORMAL LOW (ref 3.87–5.11)
RDW: 13.1 % (ref 11.5–15.5)
WBC: 6.5 10*3/uL (ref 4.0–10.5)
nRBC: 0 % (ref 0.0–0.2)

## 2022-09-20 LAB — COMPREHENSIVE METABOLIC PANEL
ALT: 11 U/L (ref 0–44)
AST: 13 U/L — ABNORMAL LOW (ref 15–41)
Albumin: 3.1 g/dL — ABNORMAL LOW (ref 3.5–5.0)
Alkaline Phosphatase: 40 U/L (ref 38–126)
Anion gap: 5 (ref 5–15)
BUN: 9 mg/dL (ref 6–20)
CO2: 25 mmol/L (ref 22–32)
Calcium: 8.3 mg/dL — ABNORMAL LOW (ref 8.9–10.3)
Chloride: 105 mmol/L (ref 98–111)
Creatinine, Ser: 0.72 mg/dL (ref 0.44–1.00)
GFR, Estimated: >60 mL/min (ref >60)
Glucose, Bld: 112 mg/dL — ABNORMAL HIGH (ref 70–99)
Potassium: 4.1 mmol/L (ref 3.5–5.1)
Sodium: 135 mmol/L (ref 135–145)
Total Bilirubin: 0.7 mg/dL (ref 0.3–1.2)
Total Protein: 5 g/dL — ABNORMAL LOW (ref 6.5–8.1)

## 2022-09-20 MED ORDER — ACETAMINOPHEN 500 MG PO TABS
ORAL_TABLET | ORAL | Status: AC
  Filled 2022-09-20: qty 2

## 2022-09-20 MED ORDER — KETOROLAC TROMETHAMINE 15 MG/ML IJ SOLN
INTRAMUSCULAR | Status: AC
  Filled 2022-09-20: qty 1

## 2022-09-20 MED ORDER — OXYCODONE-ACETAMINOPHEN 5-325 MG PO TABS: 1.0000 | ORAL_TABLET | ORAL | 0 refills | Status: AC | PRN

## 2022-09-20 MED ORDER — OXYCODONE HCL 5 MG PO TABS
ORAL_TABLET | ORAL | Status: AC
  Filled 2022-09-20: qty 2

## 2022-09-20 MED ORDER — IBUPROFEN 600 MG PO TABS: 600.0000 mg | ORAL_TABLET | Freq: Four times a day (QID) | ORAL | 1 refills | Status: AC

## 2022-09-20 NOTE — Discharge Instructions (Signed)
Call MD for T>100.4, heavy vaginal bleeding, severe abdominal pain, intractable nausea and/or vomiting, or respiratory distress.  Call office to schedule postop visit in 2 weeks.  Pelvic rest x 6 weeks.  No driving while taking narcotics. 

## 2022-09-20 NOTE — Progress Notes (Signed)
1 Day Post-Op Procedure(s) (LRB): HYSTERECTOMY ABDOMINAL WITH SALPINGECTOMY (Bilateral)  Subjective: Patient reports tolerating PO.  Ambulating well.  Pain is well controlled on po meds.  Foley out but no void yet.  No N/V.  No flatus yet  Objective: I have reviewed patient's vital signs, intake and output, medications, and labs.  General: alert, cooperative, and appears stated age Extremities: extremities normal, atraumatic, no cyanosis or edema Abd: soft, ND.  Honeycomb dressing with unchanged small stain on right side.  Assessment: s/p Procedure(s): HYSTERECTOMY ABDOMINAL WITH SALPINGECTOMY (Bilateral): stable, progressing well, and tolerating diet  Plan: Discharge home once able to void   LOS: 0 days    Mitchel Honour, DO 09/20/2022, 7:06 AM

## 2022-09-20 NOTE — Discharge Summary (Signed)
Physician Discharge Summary  Patient ID: Kerri Yu MRN: [id number] DOB/AGE: 01-02-1981 42 y.o.  Admit date: 09/19/2022 Discharge date: 09/20/2022  Admission Diagnoses:  Symptomatic uterine leiomyomata  Discharge Diagnoses:  Principal Problem:   Uterine leiomyoma Active Problems:   S/P abdominal hysterectomy   Discharged Condition: good  Hospital Course: Patient was admitted for anticipated TAH, BS.  She underwent the procedure without complication.  Please see op note for details.  She progressed well postoperatively and on POD#1, patient was meet all postop goals for discharge.  She was ambulating well, tolerating po, voiding without difficulty and had adequate pain control.  Consults: None  Significant Diagnostic Studies: none  Treatments: surgery: TAH, BS  Discharge Exam: Blood pressure (!) 83/45, pulse (!) 54, temperature 98.2 F (36.8 C), resp. rate 16, height 5\' 4"  (1.626 m), weight 57.5 kg, last menstrual period 09/01/2022, SpO2 100 %, not currently breastfeeding. General appearance: alert, cooperative, and appears stated age Extremities: extremities normal, atraumatic, no cyanosis or edema Abd: soft, ND.  Honeycomb in place  Disposition: Discharge disposition: 01-Home or Self Care       Discharge Instructions     Discharge patient   Complete by: As directed    Discharge disposition: 01-Home or Self Care   Discharge patient date: 09/20/2022      Allergies as of 09/20/2022       Reactions   Biaxin [clarithromycin] Hives        Medication List     TAKE these medications    cetirizine 10 MG tablet Commonly known as: ZYRTEC Take 10 mg by mouth daily.   ibuprofen 600 MG tablet Commonly known as: ADVIL Take 1 tablet (600 mg total) by mouth 4 (four) times daily.   oxyCODONE-acetaminophen 5-325 MG tablet Commonly known as: Percocet Take 1 tablet by mouth every 4 (four) hours as needed for up to 7 days for severe pain.          Signed: Mitchel Honour 09/20/2022, 7:10 AM

## 2022-09-28 LAB — SURGICAL PATHOLOGY

## 2023-05-28 ENCOUNTER — Ambulatory Visit
Admission: RE | Admit: 2023-05-28 | Discharge: 2023-05-28 | Disposition: A | Discharge status: Home / Self Care | Payer: 59 | Source: Ambulatory Visit | Attending: Family Medicine | Admitting: Family Medicine

## 2023-05-28 VITALS — BP 110/72 | HR 78 | Temp 98.2°F | Resp 16

## 2023-05-28 DIAGNOSIS — J018 Other acute sinusitis: Secondary | ICD-10-CM

## 2023-05-28 DIAGNOSIS — J309 Allergic rhinitis, unspecified: Secondary | ICD-10-CM

## 2023-05-28 MED ORDER — AMOXICILLIN-POT CLAVULANATE 875-125 MG PO TABS: 1.0000 | ORAL_TABLET | Freq: Two times a day (BID) | ORAL | 0 refills | Status: AC

## 2023-05-28 MED ORDER — PREDNISONE 20 MG PO TABS: ORAL_TABLET | ORAL | 0 refills | Status: AC

## 2023-05-28 MED ORDER — PROMETHAZINE-DM 6.25-15 MG/5ML PO SYRP: 5.0000 mL | ORAL_SOLUTION | Freq: Every evening | ORAL | 0 refills | Status: AC | PRN

## 2023-05-28 NOTE — ED Triage Notes (Signed)
 Pt c/o cough x 2 weeks-woke this am with head congestion-denies fever-no OTC meds-NAD-steady gait

## 2023-05-28 NOTE — ED Provider Notes (Signed)
 [facility]  Note:  This document was prepared using Conservation officer, historic buildings and may include unintentional dictation errors.  MRN: [id number] DOB: 1981-03-14  Subjective:   Kerri Yu is a 43 y.o. female presenting for 2-week history of persistent hacking cough.  Cough has been dry.  Has also had drainage.  Started to have significant congestion in the past couple of days.  No chest pain, shortness of breath or wheezing.  Has a remote history of pneumonia.  No smoking of any kind including cigarettes, cigars, vaping, marijuana use.  No asthma.  No current facility-administered medications for this encounter.  Current Outpatient Medications:    cetirizine (ZYRTEC) 10 MG tablet, Take 10 mg by mouth daily., Disp: , Rfl:    ibuprofen  (ADVIL ) 600 MG tablet, Take 1 tablet (600 mg total) by mouth 4 (four) times daily., Disp: 30 tablet, Rfl: 1   Allergies  Allergen Reactions   Biaxin [Clarithromycin] Hives    Past Medical History:  Diagnosis Date   Anxiety    hx panic attacks took zoloft in the past   Blood transfusion without reported diagnosis 2015   History of anemia    History of postpartum hemorrhage, currently pregnant 2015   Pneumonia 02/23/2022   community acquired pneumonia   Umbilical hernia 2021   Uterine leiomyoma      Past Surgical History:  Procedure Laterality Date   CESAREAN SECTION N/A 03/12/2014   Procedure: CESAREAN SECTION;  Surgeon: Duwaine Blumenthal, DO;  Location: [facility];  Service: Obstetrics;  Laterality: N/A;   CESAREAN SECTION N/A 12/05/2016   Procedure: CESAREAN SECTION;  Surgeon: Blumenthal Duwaine, DO;  Location: [facility];  Service: Obstetrics;  Laterality: N/A;  Repeat edc 12/12/16 allergic to biaxin Heather K, RNFA   HYSTERECTOMY ABDOMINAL WITH SALPINGECTOMY Bilateral 09/19/2022   Procedure: HYSTERECTOMY ABDOMINAL WITH SALPINGECTOMY;  Surgeon: Blumenthal Duwaine, DO;  Location: [facility];  Service:  Gynecology;  Laterality: Bilateral;   UMBILICAL HERNIA REPAIR N/A 02/2020    Family History  Problem Relation Age of Onset   Thyroid disease Mother    Kidney disease Mother        acute kidney failure   Hypertension Father    Diabetes Paternal Grandmother     Social History   Tobacco Use   Smoking status: Never   Smokeless tobacco: Never  Vaping Use   Vaping status: Never Used  Substance Use Topics   Alcohol use: Yes    Comment: rare   Drug use: No    ROS   Objective:   Vitals: There were no vitals taken for this visit.  Physical Exam Constitutional:      General: She is not in acute distress.    Appearance: Normal appearance. She is well-developed and normal weight. She is not ill-appearing, toxic-appearing or diaphoretic.  HENT:     Head: Normocephalic and atraumatic.     Right Ear: Tympanic membrane, ear canal and external ear normal. No drainage or tenderness. No middle ear effusion. There is no impacted cerumen. Tympanic membrane is not erythematous or bulging.     Left Ear: Tympanic membrane, ear canal and external ear normal. No drainage or tenderness.  No middle ear effusion. There is no impacted cerumen. Tympanic membrane is not erythematous or bulging.     Nose: Congestion present. No rhinorrhea.     Mouth/Throat:     Mouth: Mucous membranes are moist. No oral lesions.     Pharynx: Posterior oropharyngeal  erythema (with associated postnasal drainage overlying pharynx) present. No pharyngeal swelling, oropharyngeal exudate or uvula swelling.     Tonsils: No tonsillar exudate or tonsillar abscesses.  Eyes:     General: No scleral icterus.       Right eye: No discharge.        Left eye: No discharge.     Extraocular Movements: Extraocular movements intact.     Right eye: Normal extraocular motion.     Left eye: Normal extraocular motion.     Conjunctiva/sclera: Conjunctivae normal.  Cardiovascular:     Rate and Rhythm: Normal rate and regular rhythm.      Heart sounds: Normal heart sounds. No murmur heard.    No friction rub. No gallop.  Pulmonary:     Effort: Pulmonary effort is normal. No respiratory distress.     Breath sounds: No stridor. No wheezing, rhonchi or rales.  Chest:     Chest wall: No tenderness.  Musculoskeletal:     Cervical back: Normal range of motion and neck supple.  Lymphadenopathy:     Cervical: No cervical adenopathy.  Skin:    General: Skin is warm and dry.  Neurological:     General: No focal deficit present.     Mental Status: She is alert and oriented to person, place, and time.  Psychiatric:        Mood and Affect: Mood normal.        Behavior: Behavior normal.     Assessment and Plan :   PDMP not reviewed this encounter.  1. Acute non-recurrent sinusitis of other sinus   2. Allergic rhinitis, unspecified seasonality, unspecified trigger    Deferred imaging given clear cardiopulmonary exam, hemodynamically stable vital signs.  Will start empiric treatment for sinusitis with Augmentin .  Patient also has allergic rhinitis, takes Zyrtec daily.  Maintain this.  Will use an oral prednisone  course for acute on chronic rhinitis.  Recommended supportive care otherwise. Counseled patient on potential for adverse effects with medications prescribed/recommended today, ER and return-to-clinic precautions discussed, patient verbalized understanding.    Christopher Savannah, [place] 05/28/23 1152

## 2023-05-28 NOTE — Discharge Instructions (Signed)
 We will manage this as a sinus infection with Augmentin . For sore throat or cough try using a honey-based tea. Use 3 teaspoons of honey with juice squeezed from half lemon. Place shaved pieces of ginger into 1/2-1 cup of water and warm over stove top. Then mix the ingredients and repeat every 4 hours as needed. Please take Tylenol  500mg -650mg  every 6 hours for throat pain, fevers, aches and pains. Hydrate very well with at least 2 liters of water. Eat light meals such as soups (chicken and noodles, vegetable, chicken and wild rice).  Do not eat foods that you are allergic to.  Keep taking Zyrtec, we are adding prednisone  to help with the sinus and upper respiratory inflammation. Use cough medication as needed.

## 2023-06-09 IMAGING — DX DG HIP (WITH OR WITHOUT PELVIS) 2-3V*L*
3 series · 3 of 3 positions shown · non-contrast
Comparison: None

CLINICAL DATA: Stabbing burning pain in the left hip. Limited range
of motion.

EXAM:
DG HIP (WITH OR WITHOUT PELVIS) 2-3V LEFT

[pelvis ap]
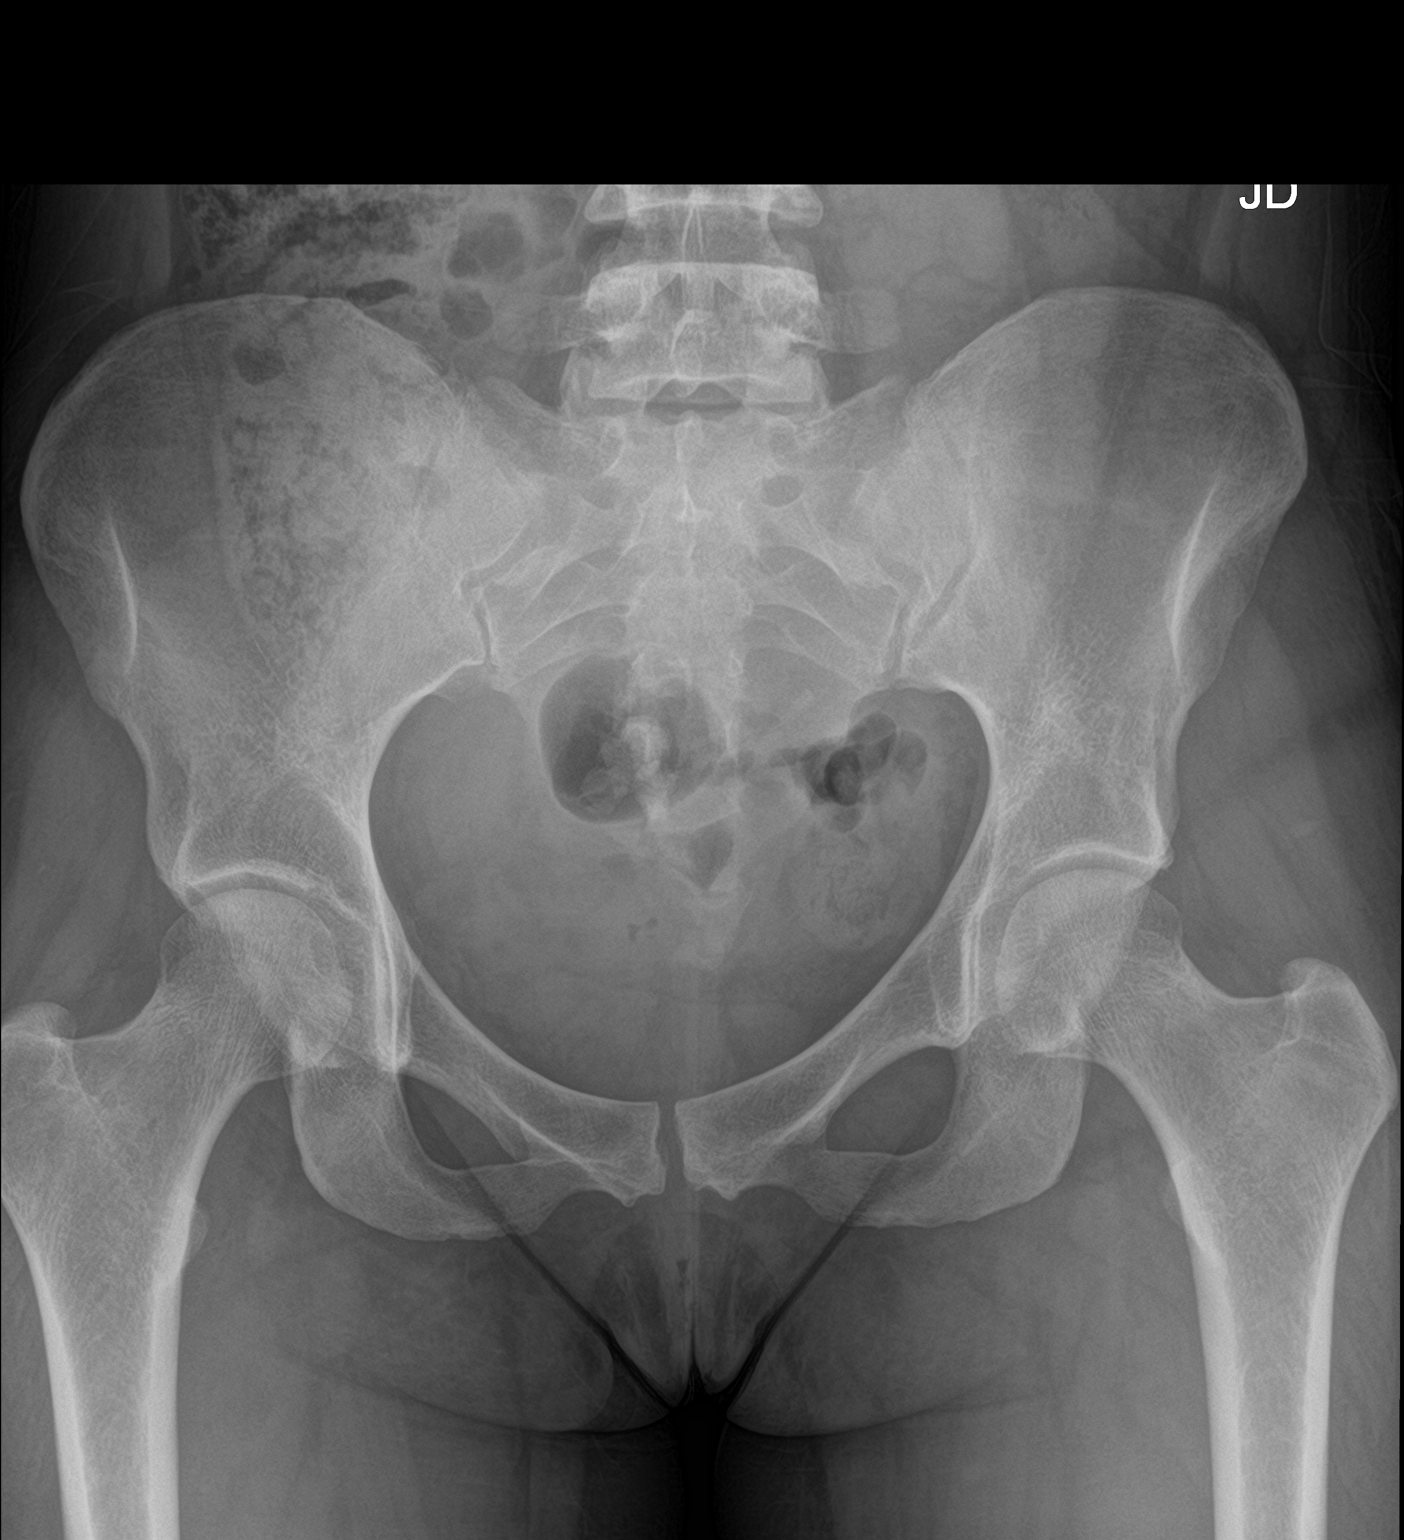

[hip ap]
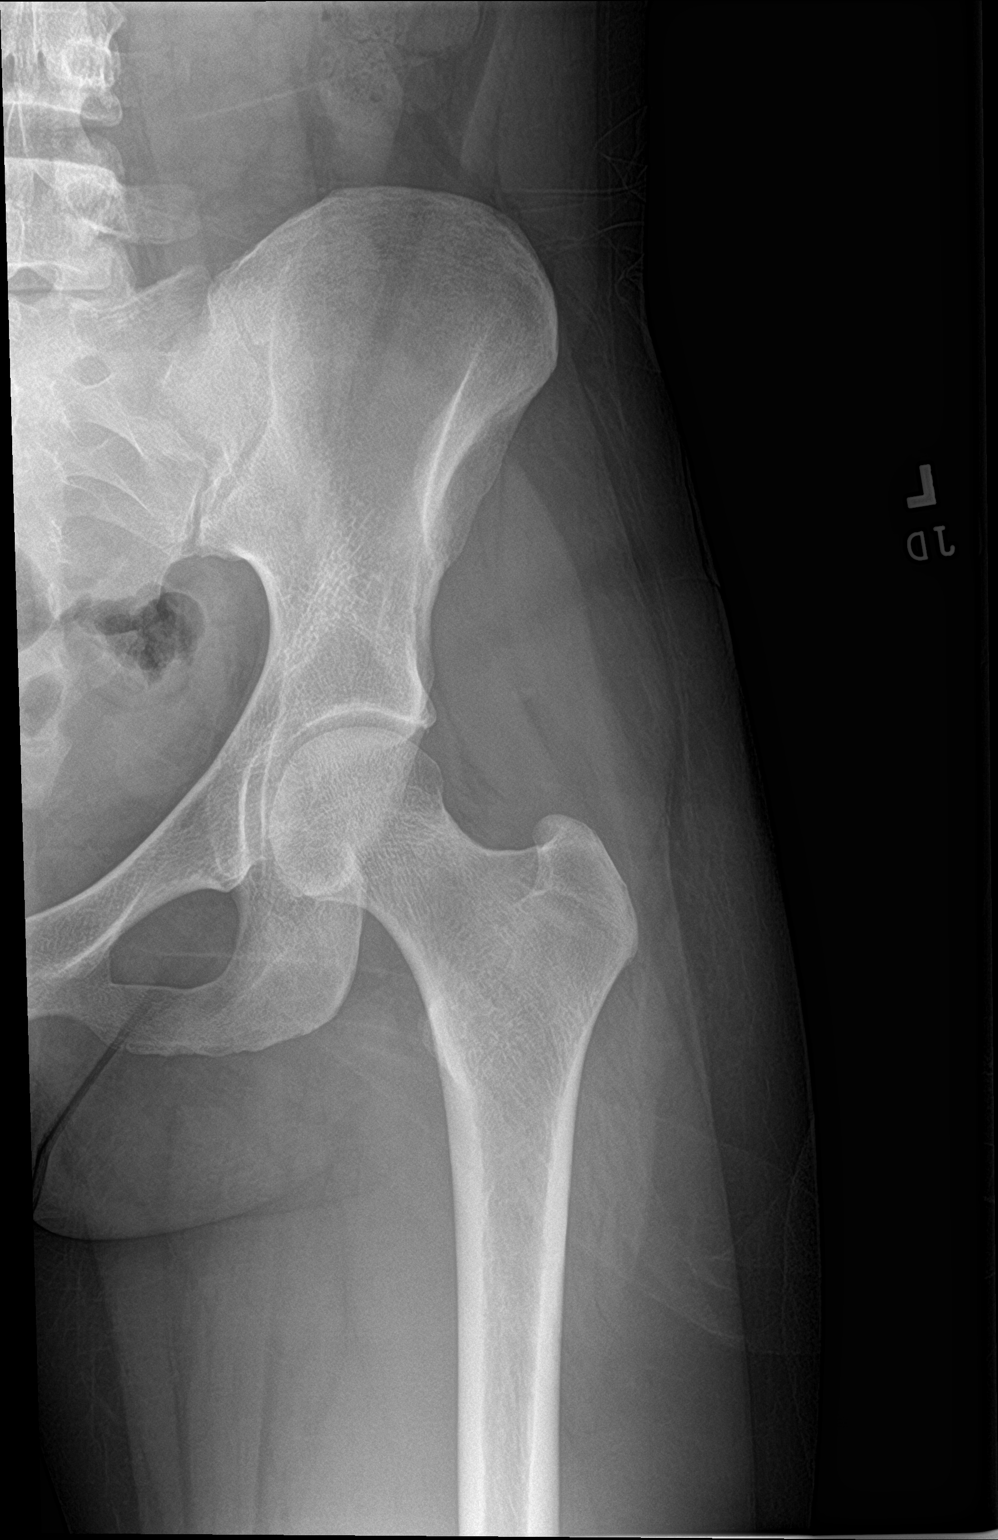

[hip lat]
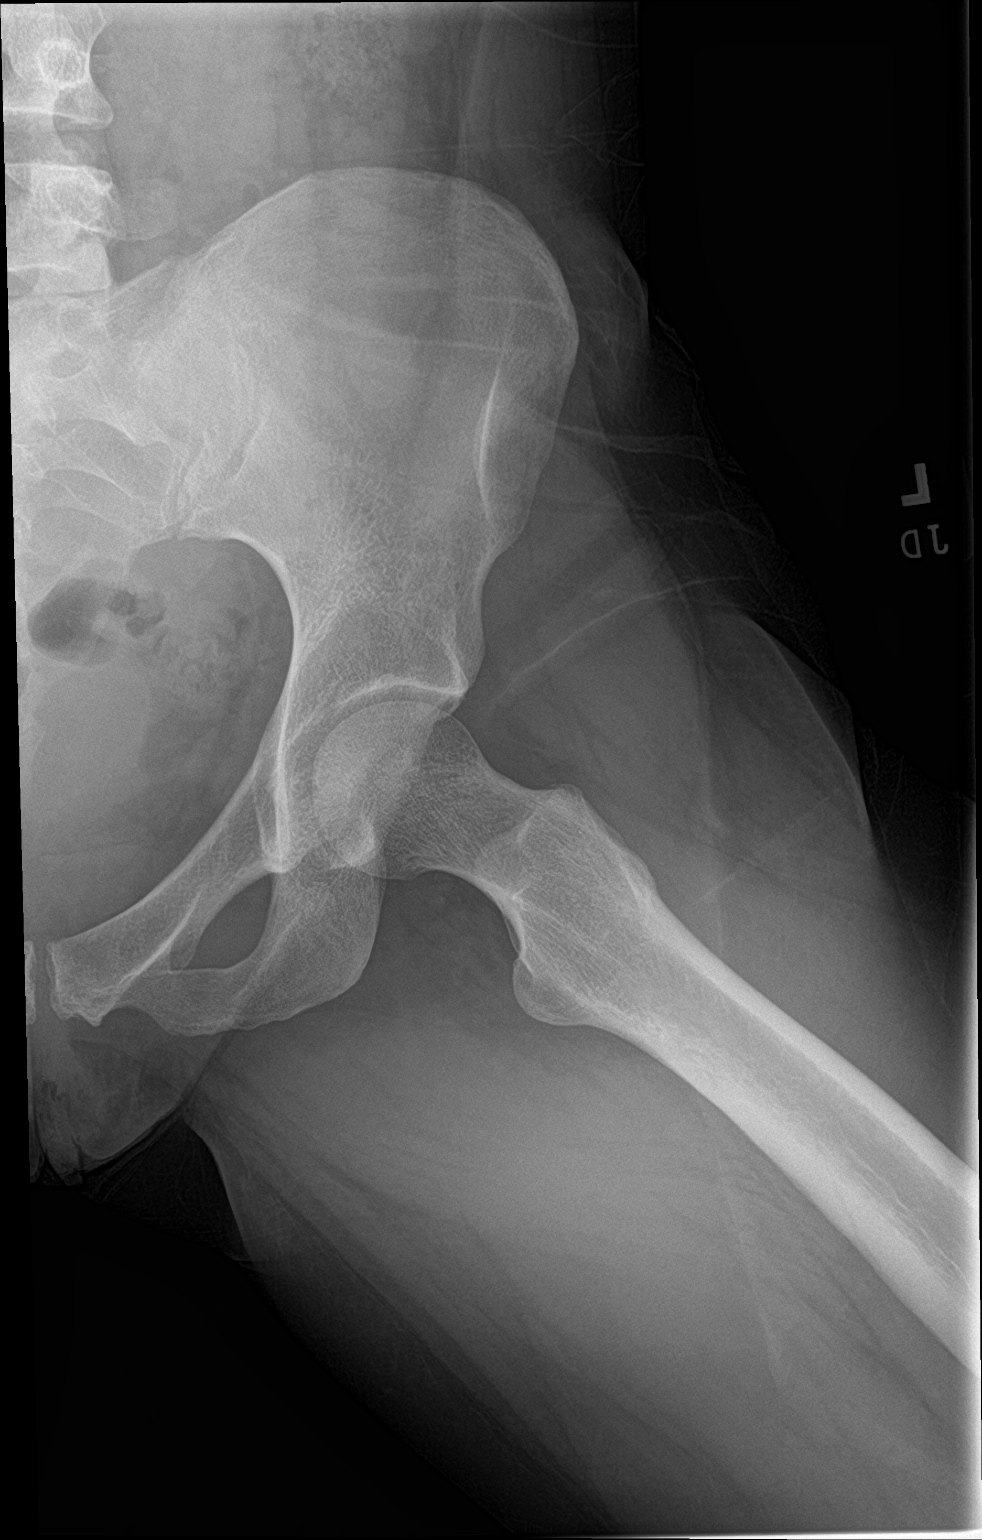

[3 of 3 positions shown; findings below may reference images not displayed]

FINDINGS: No pelvic bone abnormality. Sacroiliac joints and symphysis pubis
are normal. No hip abnormality. No joint space narrowing, osteophyte
formation or avascular necrosis.
IMPRESSION: Normal radiographs.

## 2023-10-02 ENCOUNTER — Ambulatory Visit
Admission: EM | Admit: 2023-10-02 | Discharge: 2023-10-02 | Disposition: A | Discharge status: Home / Self Care | Attending: Family Medicine | Admitting: Family Medicine

## 2023-10-02 ENCOUNTER — Other Ambulatory Visit: Payer: Self-pay

## 2023-10-02 DIAGNOSIS — H01111 Allergic dermatitis of right upper eyelid: Secondary | ICD-10-CM

## 2023-10-02 DIAGNOSIS — H01114 Allergic dermatitis of left upper eyelid: Secondary | ICD-10-CM

## 2023-10-02 MED ORDER — HYDROCORTISONE 0.5 % EX CREA
1.0000 | TOPICAL_CREAM | Freq: Every day | CUTANEOUS | 0 refills | Status: AC
Start: 2023-10-02 — End: 2023-10-09

## 2023-10-02 NOTE — ED Triage Notes (Signed)
 Pt c/o eyelids are red, itching and swollenx2d.Pt states it's worse today. Pt's upper eye lids are erythematous. Pt has 1+ swelling of right upper eyelid and trace swelling of left upper eyelid.

## 2023-10-02 NOTE — ED Provider Notes (Signed)
 [facility]    CSN: [id number] Arrival date & time: 10/02/23  1631      History   Chief Complaint No chief complaint on file.   HPI Kerri Yu is a 43 y.o. female presents for eyelid itching.  Patient reports 2 days of flaking/dry skin on bilateral upper eyelids that are itchy slightly swollen.  Denies any drainage.  Does have allergies and states she occasionally gets itchy upper eyelids but this seems more persistent than normal.  No new contacts including soaps, detergents, medications, lotions, etc.  She has been doing Vaseline which seems to be helping.  No other concerns at this time.  HPI  Past Medical History:  Diagnosis Date   Anxiety    hx panic attacks took zoloft in the past   Blood transfusion without reported diagnosis 2015   History of anemia    History of postpartum hemorrhage, currently pregnant 2015   Pneumonia 02/23/2022   community acquired pneumonia   Umbilical hernia 2021   Uterine leiomyoma     Patient Active Problem List   Diagnosis Date Noted   Uterine leiomyoma 09/19/2022   S/P abdominal hysterectomy 09/19/2022   S/P cesarean section 03/12/2014   Pregnancy 03/11/2014    Past Surgical History:  Procedure Laterality Date   CESAREAN SECTION N/A 03/12/2014   Procedure: CESAREAN SECTION;  Surgeon: Dyanna Glasgow, DO;  Location: [facility];  Service: Obstetrics;  Laterality: N/A;   CESAREAN SECTION N/A 12/05/2016   Procedure: CESAREAN SECTION;  Surgeon: Dyanna Glasgow, DO;  Location: [facility];  Service: Obstetrics;  Laterality: N/A;  Repeat edc 12/12/16 allergic to biaxin Heather K, RNFA   HYSTERECTOMY ABDOMINAL WITH SALPINGECTOMY Bilateral 09/19/2022   Procedure: HYSTERECTOMY ABDOMINAL WITH SALPINGECTOMY;  Surgeon: Dyanna Glasgow, DO;  Location: [facility] [place];  Service: Gynecology;  Laterality: Bilateral;   UMBILICAL HERNIA REPAIR N/A 02/2020    OB History     Gravida  2   Para  2   Term  2   Preterm  0    AB  0   Living  1      SAB  0   IAB  0   Ectopic  0   Multiple  0   Live Births  1            Home Medications    Prior to Admission medications   Medication Sig Start Date End Date Taking? Authorizing Provider  fluticasone (FLONASE) 50 MCG/ACT nasal spray Place 2 sprays into both nostrils daily.   Yes [provider]  hydrocortisone cream 0.5 % Apply 1 Application topically daily for 7 days. Apply sparingly once a day and to bilateral upper eyelids avoiding the eye. 10/02/23 10/09/23 Yes Verity Gilcrest, Jodi R, NP  amoxicillin -clavulanate (AUGMENTIN ) 875-125 MG tablet Take 1 tablet by mouth 2 (two) times daily. 05/28/23   Adolph Hoop, PA-C  cetirizine (ZYRTEC) 10 MG tablet Take 10 mg by mouth daily.    [provider]  ibuprofen  (ADVIL ) 600 MG tablet Take 1 tablet (600 mg total) by mouth 4 (four) times daily. 09/20/22   Morris, Megan, DO  predniSONE  (DELTASONE ) 20 MG tablet Take 2 tablets daily with breakfast. 05/28/23   Adolph Hoop, PA-C  promethazine -dextromethorphan (PROMETHAZINE -DM) 6.25-15 MG/5ML syrup Take 5 mLs by mouth at bedtime as needed for cough. 05/28/23   Adolph Hoop, PA-C    Family History Family History  Problem Relation Age of Onset   Thyroid disease Mother    Kidney disease  Mother        acute kidney failure   Hypertension Father    Diabetes Paternal Grandmother     Social History Social History   Tobacco Use   Smoking status: Never   Smokeless tobacco: Never  Vaping Use   Vaping status: Never Used  Substance Use Topics   Alcohol use: Yes    Comment: rare   Drug use: No     Allergies   Biaxin [clarithromycin]   Review of Systems Review of Systems  Skin:  Positive for rash.     Physical Exam Triage Vital Signs ED Triage Vitals  Encounter Vitals Group     BP 10/02/23 1646 123/78     Systolic BP Percentile --      Diastolic BP Percentile --      Pulse Rate 10/02/23 1646 75     Resp 10/02/23 1646 16     Temp 10/02/23 1646  98.3 F (36.8 C)     Temp Source 10/02/23 1646 Oral     SpO2 10/02/23 1646 98 %     Weight --      Height --      Head Circumference --      Peak Flow --      Pain Score 10/02/23 1644 0     Pain Loc --      Pain Education --      Exclude from Growth Chart --    No data found.  Updated Vital Signs BP 123/78   Pulse 75   Temp 98.3 F (36.8 C) (Oral)   Resp 16   LMP 09/01/2022 (Exact Date)   SpO2 98%   Visual Acuity Right Eye Distance:   Left Eye Distance:   Bilateral Distance:    Right Eye Near:   Left Eye Near:    Bilateral Near:     Physical Exam Vitals and nursing note reviewed.  Constitutional:      General: She is not in acute distress.    Appearance: Normal appearance. She is not ill-appearing.  HENT:     Head: Normocephalic and atraumatic.  Eyes:     Pupils: Pupils are equal, round, and reactive to light.     Comments: There are some dry flaky skin to bilateral upper eyelids with very minimal swelling, right greater than left.  No drainage, stye.  No periorbital swelling or erythema.  Cardiovascular:     Rate and Rhythm: Normal rate.  Pulmonary:     Effort: Pulmonary effort is normal.  Skin:    General: Skin is warm and dry.  Neurological:     General: No focal deficit present.     Mental Status: She is alert and oriented to person, place, and time.  Psychiatric:        Mood and Affect: Mood normal.        Behavior: Behavior normal.      UC Treatments / Results  Labs (all labs ordered are listed, but only abnormal results are displayed) Labs Reviewed - No data to display  EKG   Radiology No results found.  Procedures Procedures (including critical care time)  Medications Ordered in UC Medications - No data to display  Initial Impression / Assessment and Plan / UC Course  I have reviewed the triage vital signs and the nursing notes.  Pertinent labs & imaging results that were available during my care of the patient were reviewed by me  and considered in my medical decision making (see chart  for details).     Reviewed exam and symptoms with patient.  No red flags.  Discussed eyelid dermatitis.  Will do low-dose hydrocortisone 0.5% topically once a day for 7 days.  She is advised to apply sparingly to avoid eyes.  Continue Vaseline or Aquaphor lotion as needed.  Also advised to continue allergy medicine.  PCP follow-up if symptoms do not improve.  ER precautions reviewed and patient verbalized understanding. Final Clinical Impressions(s) / UC Diagnoses   Final diagnoses:  Allergic dermatitis of both upper eyelids     Discharge Instructions      You may use the 0.5% hydrocortisone cream daily to the privates.  Apply sparingly, avoid the eyes, do not use more than 7 days.  Continue Vaseline or Aquaphor over-the-counter for hydration.  Continue your allergy medicine daily.  Follow-up with your PCP if your symptoms do not improve.  Please go to the ER for any worsening symptoms.  Hope you feel better soon!  ED Prescriptions     Medication Sig Dispense Auth. Provider   hydrocortisone cream 0.5 % Apply 1 Application topically daily for 7 days. Apply sparingly once a day and to bilateral upper eyelids avoiding the eye. 30 g Monik Lins, Jodi R, NP      PDMP not reviewed this encounter.   Alleen Arbour, NP 10/02/23 1735

## 2023-10-02 NOTE — Discharge Instructions (Addendum)
 You may use the 0.5% hydrocortisone cream daily to the privates.  Apply sparingly, avoid the eyes, do not use more than 7 days.  Continue Vaseline or Aquaphor over-the-counter for hydration.  Continue your allergy medicine daily.  Follow-up with your PCP if your symptoms do not improve.  Please go to the ER for any worsening symptoms.  Hope you feel better soon!

## 2023-10-03 ENCOUNTER — Ambulatory Visit: Payer: Self-pay
